# Patient Record
Sex: Female | Born: 1995 | Race: Black or African American | Marital: Married | Smoking: Never smoker
Health system: Southern US, Community
[De-identification: ages and names within clinical notes are randomized; demographics above are authoritative.]

## PROBLEM LIST (undated history)

## (undated) DIAGNOSIS — Z789 Other specified health status: Secondary | ICD-10-CM

## (undated) HISTORY — DX: Other specified health status: Z78.9

## (undated) HISTORY — PX: NO PAST SURGERIES: SHX2092

---

## 2020-05-15 NOTE — L&D Delivery Note (Signed)
OB/GYN Faculty Practice Delivery Note  Lisa Santiago is a 25 y.o. G2P1001 s/p SVD at [redacted]w[redacted]d. She presented in active labor with spontaneous ROM at home prior to arrival.   ROM: 0h 43m with meconium stained fluid GBS Status:  Negative/-- (09/06 1542)  Labor Progress: Initial SVE: 10/100.   Delivery Date/Time: 10/1 0025  Delivery: Presented to MAU complete and pushing. Called to room by MAU provider. No interpreter available in [place], no support personnel present at time of delivery. With assistance from [place], CNM, head delivered L OA. No nuchal cord present. Shoulder and body delivered in usual fashion. Infant with spontaneous cry, placed on mother's abdomen, dried and stimulated. Cord clamped x 2 after 1-minute delay, and cut by provider. Cord blood drawn. Placenta delivered spontaneously with gentle cord traction. Fundus boggy with massage and Pitocin. With continued massage and TXA, fundus firmed with improvement in bleeding. Labia, perineum, vagina, and cervix inspected inspected with 1st and 2nd degree perineal lacerations that were repaired in usual fashion with 3.0 vicryl.    Delivery call was made prior to delivery due to prolonged fetal bradycardia, however upon delivery infant with spontaneous cry as noted above. Reminder of infant care was per MAU RN team.   Baby Weight: pending  Placenta: Sent to L&D Complications: None Lacerations: 1st degree (maternal right) and 2nd degree (left) perineal EBL: 450 mL Analgesia: Lidocaine for repair   Infant:  APGAR (1 MIN): 9   APGAR (5 MINS): 9   APGAR (10 MINS):     [name], DO  OB Family Medicine Fellow, [facility] for [facility], [facility] 02/12/2021, 1:32 AM

## 2020-09-13 DIAGNOSIS — Z13220 Encounter for screening for lipoid disorders: Secondary | ICD-10-CM | POA: Diagnosis not present

## 2020-09-13 DIAGNOSIS — Z331 Pregnant state, incidental: Secondary | ICD-10-CM | POA: Diagnosis not present

## 2020-09-13 DIAGNOSIS — Z131 Encounter for screening for diabetes mellitus: Secondary | ICD-10-CM | POA: Diagnosis not present

## 2020-09-13 DIAGNOSIS — Z0000 Encounter for general adult medical examination without abnormal findings: Secondary | ICD-10-CM | POA: Diagnosis not present

## 2020-09-13 DIAGNOSIS — N760 Acute vaginitis: Secondary | ICD-10-CM | POA: Diagnosis not present

## 2020-09-29 ENCOUNTER — Ambulatory Visit (INDEPENDENT_AMBULATORY_CARE_PROVIDER_SITE_OTHER): Payer: BC Managed Care – PPO

## 2020-09-29 ENCOUNTER — Other Ambulatory Visit: Payer: Self-pay

## 2020-09-29 ENCOUNTER — Ambulatory Visit (INDEPENDENT_AMBULATORY_CARE_PROVIDER_SITE_OTHER): Payer: BC Managed Care – PPO | Admitting: *Deleted

## 2020-09-29 DIAGNOSIS — Z3201 Encounter for pregnancy test, result positive: Secondary | ICD-10-CM | POA: Diagnosis not present

## 2020-09-29 DIAGNOSIS — Z3490 Encounter for supervision of normal pregnancy, unspecified, unspecified trimester: Secondary | ICD-10-CM | POA: Diagnosis not present

## 2020-09-29 DIAGNOSIS — O0930 Supervision of pregnancy with insufficient antenatal care, unspecified trimester: Secondary | ICD-10-CM

## 2020-09-29 LAB — POCT PREGNANCY, URINE: Preg Test, Ur: POSITIVE — AB

## 2020-09-29 NOTE — Progress Notes (Signed)
Pt presents for urine pregnancy test which is positive. She speaks Mandingo. There is no interpreter available through [employer] at the time of appointment. Her father is present during the visit and is acting as interpreter. Pt states LMP during the last week of December - unsure of exact date. She came to the [place] on July 11, 2020. She had not had any pregnancy test performed prior to that time. Pt's [facility] 5/2 and the doctor told them that she was pregnant due to positive urine pregnancy test and physical exam. Pt is not aware of whether or not she has felt FM. She denies vaginal bleeding or abdominal pain. Limited US performed in office which confirms IUP. Cardiac activity present w/FHR = 153 bpm . Femur length measurement is consistent with [redacted]w[redacted]d, EDD 02/14/21. Vigorous fetal movement was observed during scan. Pt was advised of need for ultrasound for anatomy and confirmation of EDD. Dr. Debroah Loop in room to speak with pt and her father. [place] for anatomy/dates scheduled on 10/15/20 @ 2:45. Prenatal care will be scheduled. [facility] if she develops vaginal bleeding or abdominal pain. She voiced understanding of all information and instructions given.

## 2020-10-13 ENCOUNTER — Encounter: Payer: Self-pay | Admitting: *Deleted

## 2020-10-14 ENCOUNTER — Other Ambulatory Visit (HOSPITAL_COMMUNITY)
Admission: RE | Admit: 2020-10-14 | Discharge: 2020-10-14 | Disposition: A | Discharge status: Home / Self Care | Payer: BC Managed Care – PPO | Source: Ambulatory Visit | Attending: Advanced Practice Midwife | Admitting: Advanced Practice Midwife

## 2020-10-14 ENCOUNTER — Encounter: Payer: Self-pay | Admitting: Advanced Practice Midwife

## 2020-10-14 ENCOUNTER — Other Ambulatory Visit: Payer: Self-pay

## 2020-10-14 ENCOUNTER — Ambulatory Visit (INDEPENDENT_AMBULATORY_CARE_PROVIDER_SITE_OTHER): Payer: BC Managed Care – PPO | Admitting: Advanced Practice Midwife

## 2020-10-14 VITALS — BP 122/81 | HR 106 | Ht 62.0 in | Wt 150.3 lb

## 2020-10-14 DIAGNOSIS — Z3A22 22 weeks gestation of pregnancy: Secondary | ICD-10-CM

## 2020-10-14 DIAGNOSIS — Z3480 Encounter for supervision of other normal pregnancy, unspecified trimester: Secondary | ICD-10-CM | POA: Insufficient documentation

## 2020-10-14 NOTE — Progress Notes (Signed)
Subjective:   Lisa Santiago is a 25 y.o. G2P1001 at [redacted]w[redacted]d by 2nd trimester US done at 20 weeks being seen today for her first obstetrical visit.    Gynecological/Obstetrical History: Her obstetrical history is significant for none, denies complications with prior pregnancy and delivery. Patient does intend to breast feed. Pregnancy history fully reviewed. Patient reports no complaints.   Medical History/ROS:   Social History: Patient denies current usage of tobacco, alcohol, or drugs.  Patient reports the FOB is Joselyn Glassman who is living with her here in the [place]. Patient reports that her last baby had a different.  Patient reports that she lives with FOB.   HISTORY: OB History  Gravida Para Term Preterm AB Living  2 1 1  0 0 1  SAB IAB Ectopic Multiple Live Births  0 0 0 0 1    # Outcome Date GA Lbr Len/2nd Weight Sex Delivery Anes PTL Lv  2 Current           1 Term 05/13/19 [redacted]w[redacted]d   M Vag-Spont   LIV    Last pap smear was done never  Past Medical History:  Diagnosis Date  . Medical history non-contributory    Past Surgical History:  Procedure Laterality Date  . NO PAST SURGERIES     Family History  Problem Relation Age of Onset  . Healthy Mother   . Hypertension Father    Social History   Tobacco Use  . Smoking status: Never Smoker  . Smokeless tobacco: Never Used  Vaping Use  . Vaping Use: Never used  Substance Use Topics  . Alcohol use: Not Currently  . Drug use: Never   No Known Allergies Current Outpatient Medications on File Prior to Visit  Medication Sig Dispense Refill  . Prenatal Vit-Fe Fumarate-FA (PREPLUS) 27-1 MG TABS Take by mouth.     No current facility-administered medications on file prior to visit.    Review of Systems Pertinent items noted in HPI and remainder of comprehensive ROS otherwise negative.  Exam   Vitals:   10/14/20 1439 10/14/20 1446  BP: 122/81   Pulse: (!) 106   Weight: 150 lb 4.8 oz (68.2 kg)   Height:  5'  2" (1.575 m)   Fetal Heart Rate (bpm): 150  Physical Exam Constitutional:      General: She is not in acute distress. Genitourinary:     Genitourinary Comments:  External: no lesion Vagina: small amount of white discharge Cervix: pink, smooth, no CMT Uterus: AGA Adnexa: NT  HENT:     Head: Normocephalic.  Cardiovascular:     Rate and Rhythm: Normal rate.  Pulmonary:     Effort: Pulmonary effort is normal.  Abdominal:     Palpations: Abdomen is soft.  Neurological:     General: No focal deficit present.     Mental Status: She is alert and oriented to person, place, and time.  Skin:    General: Skin is warm and dry.  Psychiatric:        Mood and Affect: Mood normal.        Behavior: Behavior normal.  Vitals and nursing note reviewed. Exam conducted with a chaperone present.     Assessment:   25 y.o. year old G2P1001 Patient Active Problem List   Diagnosis Date Noted  . Supervision of other normal pregnancy, antepartum 10/14/2020     Plan:  1. Supervision of other normal pregnancy, antepartum - routine care - Has anatomy scan tomorrow  2. [redacted] weeks gestation of pregnancy   Problem list reviewed and updated. Routine obstetric precautions reviewed.  Orders Placed This Encounter  Procedures  . Culture, OB Urine  . Hemoglobin A1c  . CBC/D/Plt+RPR+Rh+ABO+RubIgG...  . AFP/Quad Scr    Weight 150 lb Dating 2nd trimester [place] 22.3 weeks today (10/14/2020) No diabetic    Scheduling Instructions:     Weight 150 lb     Dating 2nd trimester [place]     22.3 weeks today (10/14/2020)     No diabetic  . AMBULATORY REFERRAL TO [facility]    Referral Priority:   Routine    Referral Type:   Consultation    Referral Reason:   Specialty Services Required    Referral Location:   [facility]    Number of Visits Requested:   1    Return in about 4 weeks (around 11/11/2020).     Thressa Sheller DNP, CNM  10/14/20  3:03 PM

## 2020-10-15 ENCOUNTER — Other Ambulatory Visit: Payer: Self-pay | Admitting: Obstetrics & Gynecology

## 2020-10-15 ENCOUNTER — Ambulatory Visit: Payer: BC Managed Care – PPO | Attending: Obstetrics & Gynecology

## 2020-10-15 DIAGNOSIS — O0932 Supervision of pregnancy with insufficient antenatal care, second trimester: Secondary | ICD-10-CM

## 2020-10-15 DIAGNOSIS — O359XX0 Maternal care for (suspected) fetal abnormality and damage, unspecified, not applicable or unspecified: Secondary | ICD-10-CM | POA: Diagnosis not present

## 2020-10-15 DIAGNOSIS — O0930 Supervision of pregnancy with insufficient antenatal care, unspecified trimester: Secondary | ICD-10-CM

## 2020-10-15 DIAGNOSIS — Z3A22 22 weeks gestation of pregnancy: Secondary | ICD-10-CM | POA: Diagnosis not present

## 2020-10-15 DIAGNOSIS — Z3490 Encounter for supervision of normal pregnancy, unspecified, unspecified trimester: Secondary | ICD-10-CM | POA: Insufficient documentation

## 2020-10-15 DIAGNOSIS — Z3480 Encounter for supervision of other normal pregnancy, unspecified trimester: Secondary | ICD-10-CM | POA: Diagnosis not present

## 2020-10-15 LAB — GC/CHLAMYDIA PROBE AMP (~~LOC~~) NOT AT ARMC
Chlamydia: NEGATIVE
Comment: NEGATIVE
Comment: NORMAL
Neisseria Gonorrhea: NEGATIVE

## 2020-10-15 LAB — CYTOLOGY - PAP
Comment: NEGATIVE
Diagnosis: NEGATIVE
High risk HPV: NEGATIVE

## 2020-10-15 LAB — CBC/D/PLT+RPR+RH+ABO+RUBIGG...
Antibody Screen: NEGATIVE
Basophils Absolute: 0 10*3/uL (ref 0.0–0.2)
Basos: 0 %
EOS (ABSOLUTE): 0.3 10*3/uL (ref 0.0–0.4)
Eos: 2 %
HCV Ab: <0.1 s/co ratio (ref 0.0–0.9)
HIV Screen 4th Generation wRfx: NONREACTIVE
Hematocrit: 38.4 % (ref 34.0–46.6)
Hemoglobin: 12.9 g/dL (ref 11.1–15.9)
Hepatitis B Surface Ag: NEGATIVE
Immature Grans (Abs): 0.1 10*3/uL (ref 0.0–0.1)
Immature Granulocytes: 1 %
Lymphocytes Absolute: 2.2 10*3/uL (ref 0.7–3.1)
Lymphs: 21 %
MCH: 24.1 pg — ABNORMAL LOW (ref 26.6–33.0)
MCHC: 33.6 g/dL (ref 31.5–35.7)
MCV: 72 fL — ABNORMAL LOW (ref 79–97)
Monocytes Absolute: 0.5 10*3/uL (ref 0.1–0.9)
Monocytes: 5 %
Neutrophils Absolute: 7.4 10*3/uL — ABNORMAL HIGH (ref 1.4–7.0)
Neutrophils: 71 %
Platelets: 298 10*3/uL (ref 150–450)
RBC: 5.35 x10E6/uL — ABNORMAL HIGH (ref 3.77–5.28)
RDW: 15.3 % (ref 11.7–15.4)
RPR Ser Ql: NONREACTIVE
Rh Factor: POSITIVE
Rubella Antibodies, IGG: 15.3 index (ref >0.99)
WBC: 10.4 10*3/uL (ref 3.4–10.8)

## 2020-10-15 LAB — HEMOGLOBIN A1C
Est. average glucose Bld gHb Est-mCnc: 91 mg/dL
Hgb A1c MFr Bld: 4.8 % (ref 4.8–5.6)

## 2020-10-15 LAB — AFP TUMOR MARKER: AFP, Serum, Tumor Marker: 117 ng/mL — ABNORMAL HIGH (ref 0.0–4.7)

## 2020-10-15 LAB — HCV INTERPRETATION

## 2020-10-16 LAB — URINE CULTURE, OB REFLEX

## 2020-10-16 LAB — CULTURE, OB URINE

## 2020-10-18 ENCOUNTER — Telehealth: Payer: Self-pay

## 2020-10-18 ENCOUNTER — Other Ambulatory Visit: Payer: Self-pay | Admitting: *Deleted

## 2020-10-18 DIAGNOSIS — Z3201 Encounter for pregnancy test, result positive: Secondary | ICD-10-CM | POA: Diagnosis not present

## 2020-10-18 DIAGNOSIS — Z3480 Encounter for supervision of other normal pregnancy, unspecified trimester: Secondary | ICD-10-CM

## 2020-10-18 DIAGNOSIS — O283 Abnormal ultrasonic finding on antenatal screening of mother: Secondary | ICD-10-CM

## 2020-10-18 DIAGNOSIS — Z331 Pregnant state, incidental: Secondary | ICD-10-CM | POA: Diagnosis not present

## 2020-10-18 DIAGNOSIS — N760 Acute vaginitis: Secondary | ICD-10-CM | POA: Diagnosis not present

## 2020-10-18 NOTE — Telephone Encounter (Addendum)
-----   Message from Armando Reichert, CNM sent at 10/16/2020  8:02 PM EDT ----- Can someone call lab to see if we can get this afp changed. It was put in as a quad screen, but it wasn't resulted properly. It was done as a tumor marker. It needs to be the AFP tetra or quad screen.   Correct order placed. [facility] tech notified to call lab to run correct test.

## 2020-10-21 LAB — AFP, SERUM, OPEN SPINA BIFIDA
AFP MoM: 1.63
AFP Value: 133 ng/mL
Gest. Age on Collection Date: 22.3 weeks
Maternal Age At EDD: 23.5 yr
OSBR Risk 1 IN: 1944
Test Results:: NEGATIVE
Weight: 150 [lb_av]

## 2020-10-21 LAB — SPECIMEN STATUS REPORT

## 2020-11-12 ENCOUNTER — Ambulatory Visit: Payer: BC Managed Care – PPO | Admitting: *Deleted

## 2020-11-12 ENCOUNTER — Ambulatory Visit: Payer: BC Managed Care – PPO | Attending: Obstetrics and Gynecology

## 2020-11-12 ENCOUNTER — Other Ambulatory Visit: Payer: Self-pay

## 2020-11-12 VITALS — BP 144/72 | HR 125

## 2020-11-12 DIAGNOSIS — O358XX0 Maternal care for other (suspected) fetal abnormality and damage, not applicable or unspecified: Secondary | ICD-10-CM

## 2020-11-12 DIAGNOSIS — O283 Abnormal ultrasonic finding on antenatal screening of mother: Secondary | ICD-10-CM | POA: Insufficient documentation

## 2020-11-12 DIAGNOSIS — O321XX0 Maternal care for breech presentation, not applicable or unspecified: Secondary | ICD-10-CM | POA: Diagnosis not present

## 2020-11-12 DIAGNOSIS — Z362 Encounter for other antenatal screening follow-up: Secondary | ICD-10-CM | POA: Diagnosis not present

## 2020-11-12 DIAGNOSIS — Z3A26 26 weeks gestation of pregnancy: Secondary | ICD-10-CM

## 2020-11-12 DIAGNOSIS — Z3480 Encounter for supervision of other normal pregnancy, unspecified trimester: Secondary | ICD-10-CM

## 2020-11-12 DIAGNOSIS — O0932 Supervision of pregnancy with insufficient antenatal care, second trimester: Secondary | ICD-10-CM

## 2020-11-12 DIAGNOSIS — Z3687 Encounter for antenatal screening for uncertain dates: Secondary | ICD-10-CM

## 2020-11-16 ENCOUNTER — Other Ambulatory Visit: Payer: Self-pay

## 2020-11-16 ENCOUNTER — Ambulatory Visit (INDEPENDENT_AMBULATORY_CARE_PROVIDER_SITE_OTHER): Payer: BC Managed Care – PPO | Admitting: Student

## 2020-11-16 VITALS — BP 128/81 | HR 97 | Wt 155.0 lb

## 2020-11-16 DIAGNOSIS — Z789 Other specified health status: Secondary | ICD-10-CM

## 2020-11-16 DIAGNOSIS — Z3480 Encounter for supervision of other normal pregnancy, unspecified trimester: Secondary | ICD-10-CM

## 2020-11-16 DIAGNOSIS — Z3A27 27 weeks gestation of pregnancy: Secondary | ICD-10-CM

## 2020-11-16 NOTE — Progress Notes (Signed)
Cousin of patient's interpreting for today visit. CMA tried language resources and [employer] audio interpreter with no success of getting a Mandingo interpreter.

## 2020-11-16 NOTE — Progress Notes (Signed)
   PRENATAL VISIT NOTE  Subjective:  Lisa Santiago is a 25 y.o. G2P1001 at [redacted]w[redacted]d being seen today for ongoing prenatal care.  She is currently monitored for the following issues for this low-risk pregnancy and has Supervision of other normal pregnancy, antepartum on their problem list.  Patient reports no complaints.   Language barrier significantly impacted the quality of the interaction.  Contractions: Not present. Vag. Bleeding: None.  Movement: Present. Denies leaking of fluid.   The following portions of the patient's history were reviewed and updated as appropriate: allergies, current medications, past family history, past medical history, past social history, past surgical history and problem list.   Objective:   Vitals:   11/16/20 1617  BP: 128/81  Pulse: 97  Weight: 155 lb (70.3 kg)    Fetal Status: Fetal Heart Rate (bpm): 140 Fundal Height: 27 cm Movement: Present     General:  Alert, oriented and cooperative. Patient is in no acute distress.  Skin: Skin is warm and dry. No rash noted.   Cardiovascular: Normal heart rate noted  Respiratory: Normal respiratory effort, no problems with respiration noted  Abdomen: Soft, gravid, appropriate for gestational age.  Pain/Pressure: Absent     Pelvic: Cervical exam deferred        Extremities: Normal range of motion.  Edema: None  Mental Status: Normal mood and affect. Normal behavior. Normal judgment and thought content.   Assessment and Plan:  Pregnancy: G2P1001 at [redacted]w[redacted]d 1. Supervision of other normal pregnancy, antepartum -reviewed GTT; cousin helped explained testing and importance of fasting before testing -due to language barriers and cousin as Nurse, learning disability -discussed birth control, she does not want to use any method. Her husband is in [place].  -reviewed that patient will breast feed.  -suggested [facility] for baby's pediatrician Preterm labor symptoms and general obstetric precautions including but not  limited to vaginal bleeding, contractions, leaking of fluid and fetal movement were reviewed in detail with the patient. Please refer to After Visit Summary for other counseling recommendations.   Return in about 3 weeks (around 12/07/2020), or LROB.  Future Appointments  Date Time Provider Department Center  11/26/2020  8:50 AM [facility]  12/09/2020  8:20 AM [facility]  12/09/2020  8:55 AM Reva Bores, MD [facility]    Marylene Land, CNM

## 2020-11-16 NOTE — Patient Instructions (Addendum)
AREA PEDIATRIC/FAMILY PRACTICE PHYSICIANS  [place] ([id number]) [facility] Deirdre Priest, MD; Lum Babe, MD; Sheffield Slider, MD; Leveda Anna, MD; McDiarmid, MD; Jerene Bears, MD; Jennette Kettle, MD; Gwendolyn Grant, MD [address], [place], [place] [address] [phone] Mon-Fri 8:30-12:30, 1:30-5:00 Providers come to see babies at [facility] Accepting Medicaid     NEXT APPOINTMENT on 17 July IS FASTING

## 2020-11-26 ENCOUNTER — Other Ambulatory Visit: Payer: BC Managed Care – PPO

## 2020-11-26 ENCOUNTER — Other Ambulatory Visit: Payer: Self-pay

## 2020-11-26 DIAGNOSIS — Z3480 Encounter for supervision of other normal pregnancy, unspecified trimester: Secondary | ICD-10-CM

## 2020-11-27 LAB — CBC
Hematocrit: 38.1 % (ref 34.0–46.6)
Hemoglobin: 12.4 g/dL (ref 11.1–15.9)
MCH: 24.2 pg — ABNORMAL LOW (ref 26.6–33.0)
MCHC: 32.5 g/dL (ref 31.5–35.7)
MCV: 74 fL — ABNORMAL LOW (ref 79–97)
Platelets: 241 10*3/uL (ref 150–450)
RBC: 5.13 x10E6/uL (ref 3.77–5.28)
RDW: 15.2 % (ref 11.7–15.4)
WBC: 7.6 10*3/uL (ref 3.4–10.8)

## 2020-11-27 LAB — GLUCOSE TOLERANCE, 2 HOURS W/ 1HR
Glucose, 1 hour: 117 mg/dL (ref 65–179)
Glucose, 2 hour: 113 mg/dL (ref 65–152)
Glucose, Fasting: 73 mg/dL (ref 65–91)

## 2020-11-27 LAB — HIV ANTIBODY (ROUTINE TESTING W REFLEX): HIV Screen 4th Generation wRfx: NONREACTIVE

## 2020-11-27 LAB — RPR: RPR Ser Ql: NONREACTIVE

## 2020-12-09 ENCOUNTER — Ambulatory Visit (INDEPENDENT_AMBULATORY_CARE_PROVIDER_SITE_OTHER): Payer: BC Managed Care – PPO | Admitting: Family Medicine

## 2020-12-09 ENCOUNTER — Other Ambulatory Visit: Payer: BC Managed Care – PPO

## 2020-12-09 ENCOUNTER — Other Ambulatory Visit: Payer: Self-pay

## 2020-12-09 VITALS — BP 131/85 | HR 123 | Wt 160.0 lb

## 2020-12-09 DIAGNOSIS — Z3A30 30 weeks gestation of pregnancy: Secondary | ICD-10-CM

## 2020-12-09 DIAGNOSIS — Z23 Encounter for immunization: Secondary | ICD-10-CM

## 2020-12-09 DIAGNOSIS — Z3480 Encounter for supervision of other normal pregnancy, unspecified trimester: Secondary | ICD-10-CM

## 2020-12-09 DIAGNOSIS — Z789 Other specified health status: Secondary | ICD-10-CM

## 2020-12-09 NOTE — Addendum Note (Signed)
Addended byVidal Schwalbe on: 12/09/2020 09:05 AM   Modules accepted: Orders

## 2020-12-09 NOTE — Progress Notes (Signed)
   PRENATAL VISIT NOTE  Subjective:  Lisa Santiago is a 25 y.o. G2P1001 at [redacted]w[redacted]d being seen today for ongoing prenatal care.  She is currently monitored for the following issues for this low-risk pregnancy and has Supervision of other normal pregnancy, antepartum and Language barrier on their problem list.  Patient reports no complaints.  Contractions: Not present. Vag. Bleeding: None.  Movement: Present. Denies leaking of fluid.   The following portions of the patient's history were reviewed and updated as appropriate: allergies, current medications, past family history, past medical history, past social history, past surgical history and problem list.   Objective:   Vitals:   12/09/20 0839  BP: 131/85  Pulse: (!) 123  Weight: 160 lb (72.6 kg)    Fetal Status: Fetal Heart Rate (bpm): 135 Fundal Height: 30 cm Movement: Present     General:  Alert, oriented and cooperative. Patient is in no acute distress.  Skin: Skin is warm and dry. No rash noted.   Cardiovascular: Normal heart rate noted  Respiratory: Normal respiratory effort, no problems with respiration noted  Abdomen: Soft, gravid, appropriate for gestational age.  Pain/Pressure: Absent     Pelvic: Cervical exam deferred        Extremities: Normal range of motion.  Edema: None  Mental Status: Normal mood and affect. Normal behavior. Normal judgment and thought content.   Assessment and Plan:  Pregnancy: G2P1001 at [redacted]w[redacted]d 1. Supervision of other normal pregnancy, antepartum Continue routine prenatal care.  2. Language barrier Family member for Mandingo interpreter: Brother used   Preterm labor symptoms and general obstetric precautions including but not limited to vaginal bleeding, contractions, leaking of fluid and fetal movement were reviewed in detail with the patient. Please refer to After Visit Summary for other counseling recommendations.   Return in 2 weeks (on 12/23/2020) for [facility].  No future  appointments.  Reva Bores, MD

## 2020-12-09 NOTE — Patient Instructions (Signed)

## 2020-12-27 ENCOUNTER — Other Ambulatory Visit: Payer: Self-pay

## 2020-12-27 ENCOUNTER — Encounter: Payer: Self-pay | Admitting: Obstetrics and Gynecology

## 2020-12-27 ENCOUNTER — Ambulatory Visit (INDEPENDENT_AMBULATORY_CARE_PROVIDER_SITE_OTHER): Payer: BC Managed Care – PPO | Admitting: Obstetrics and Gynecology

## 2020-12-27 VITALS — BP 126/84 | HR 123 | Wt 163.2 lb

## 2020-12-27 DIAGNOSIS — Z3480 Encounter for supervision of other normal pregnancy, unspecified trimester: Secondary | ICD-10-CM

## 2020-12-27 DIAGNOSIS — Z789 Other specified health status: Secondary | ICD-10-CM

## 2020-12-27 NOTE — Patient Instructions (Signed)

## 2020-12-27 NOTE — Progress Notes (Signed)
Subjective:  Lisa Santiago is a 25 y.o. G2P1001 at [redacted]w[redacted]d being seen today for ongoing prenatal care.  She is currently monitored for the following issues for this low-risk pregnancy and has Supervision of other normal pregnancy, antepartum and Language barrier on their problem list.  Patient reports no complaints.  Contractions: Not present. Vag. Bleeding: None.  Movement: Present. Denies leaking of fluid.   The following portions of the patient's history were reviewed and updated as appropriate: allergies, current medications, past family history, past medical history, past social history, past surgical history and problem list. Problem list updated.  Objective:   Vitals:   12/27/20 1445  BP: 126/84  Pulse: (!) 123  Weight: 163 lb 3.2 oz (74 kg)    Fetal Status: Fetal Heart Rate (bpm): 163   Movement: Present     General:  Alert, oriented and cooperative. Patient is in no acute distress.  Skin: Skin is warm and dry. No rash noted.   Cardiovascular: Normal heart rate noted  Respiratory: Normal respiratory effort, no problems with respiration noted  Abdomen: Soft, gravid, appropriate for gestational age. Pain/Pressure: Absent     Pelvic:  Cervical exam deferred        Extremities: Normal range of motion.  Edema: None  Mental Status: Normal mood and affect. Normal behavior. Normal judgment and thought content.   Urinalysis:      Assessment and Plan:  Pregnancy: G2P1001 at [redacted]w[redacted]d  1. Supervision of other normal pregnancy, antepartum Stable  2. Language barrier Live interrupter used during today's visit  Preterm labor symptoms and general obstetric precautions including but not limited to vaginal bleeding, contractions, leaking of fluid and fetal movement were reviewed in detail with the patient. Please refer to After Visit Summary for other counseling recommendations.  Return in about 2 weeks (around 01/10/2021) for OB visit, face to face, any provider.   Hermina Staggers, MD

## 2021-01-12 ENCOUNTER — Encounter: Payer: BC Managed Care – PPO | Admitting: Obstetrics and Gynecology

## 2021-01-18 ENCOUNTER — Ambulatory Visit (INDEPENDENT_AMBULATORY_CARE_PROVIDER_SITE_OTHER): Payer: BC Managed Care – PPO | Admitting: Nurse Practitioner

## 2021-01-18 ENCOUNTER — Other Ambulatory Visit: Payer: Self-pay

## 2021-01-18 ENCOUNTER — Other Ambulatory Visit (HOSPITAL_COMMUNITY)
Admission: RE | Admit: 2021-01-18 | Discharge: 2021-01-18 | Disposition: A | Discharge status: Home / Self Care | Payer: BC Managed Care – PPO | Source: Ambulatory Visit | Attending: Obstetrics and Gynecology | Admitting: Obstetrics and Gynecology

## 2021-01-18 VITALS — BP 122/86 | HR 113 | Wt 167.8 lb

## 2021-01-18 DIAGNOSIS — O219 Vomiting of pregnancy, unspecified: Secondary | ICD-10-CM

## 2021-01-18 DIAGNOSIS — Z789 Other specified health status: Secondary | ICD-10-CM

## 2021-01-18 DIAGNOSIS — Z3480 Encounter for supervision of other normal pregnancy, unspecified trimester: Secondary | ICD-10-CM | POA: Diagnosis not present

## 2021-01-18 NOTE — Progress Notes (Signed)
    Subjective:  Lisa Santiago is a 25 y.o. G2P1001 at [redacted]w[redacted]d being seen today for ongoing prenatal care.  She is currently monitored for the following issues for this low-risk pregnancy and has Supervision of other normal pregnancy, antepartum and Language barrier on their problem list.  Patient reports vomiting and dizziness .  Contractions: Not present. Vag. Bleeding: None.  Movement: Present. Denies leaking of fluid.   The following portions of the patient's history were reviewed and updated as appropriate: allergies, current medications, past family history, past medical history, past social history, past surgical history and problem list. Problem list updated.  Objective:   Vitals:   01/18/21 1430  BP: 122/86  Pulse: (!) 113  Weight: 167 lb 12.8 oz (76.1 kg)    Fetal Status: Fetal Heart Rate (bpm): 144 Fundal Height: 36 cm Movement: Present     General:  Alert, oriented and cooperative. Patient is in no acute distress.  Skin: Skin is warm and dry. No rash noted.   Cardiovascular: Normal heart rate noted  Respiratory: Normal respiratory effort, no problems with respiration noted  Abdomen: Soft, gravid, appropriate for gestational age. Pain/Pressure: Present     Pelvic:  Cervical exam deferred      Vaginal swabs done  Extremities: Normal range of motion.  Edema: None  Mental Status: Normal mood and affect. Normal behavior. Normal judgment and thought content.   Urinalysis:      Assessment and Plan:  Pregnancy: G2P1001 at [redacted]w[redacted]d  1. Supervision of other normal pregnancy, antepartum Baby is moving Reviewed where to go for birth - address of [facility] Entrance C given in wrap up  - GC/Chlamydia probe amp ([place])not at [facility] - Culture, beta strep (group b only)  2. Language barrier Online interpreter used for the entire visit - had to speak quite loudly for interpreter to hear provider or patient.  Female interpreter who takes a long time to begin to speak after  hearing info to give to patient - tedious process today.  Difficult to give info to patient.  3. Nausea and vomiting in pregnancy Vomited yesterday.  Has been dizzy too from time to time. None today Advised small meals every 2-3 hours  Preterm labor symptoms and general obstetric precautions including but not limited to vaginal bleeding, contractions, leaking of fluid and fetal movement were reviewed in detail with the patient. Please refer to After Visit Summary for other counseling recommendations.  Return in about 1 week (around 01/25/2021) for in person ROB.  Nolene Bernheim, RN, MSN, NP-BC Nurse Practitioner, [facility] for [employer], [facility] 01/18/2021 4:28 PM

## 2021-01-18 NOTE — Patient Instructions (Signed)
[facility]  [address] [place] - Entrance C when it is time to have the baby.

## 2021-01-18 NOTE — Progress Notes (Signed)
Pt states has been dizzy & vomiting for a while now.

## 2021-01-20 LAB — GC/CHLAMYDIA PROBE AMP (~~LOC~~) NOT AT ARMC
Chlamydia: NEGATIVE
Comment: NEGATIVE
Comment: NORMAL
Neisseria Gonorrhea: NEGATIVE

## 2021-01-22 LAB — CULTURE, BETA STREP (GROUP B ONLY): Strep Gp B Culture: NEGATIVE

## 2021-01-26 ENCOUNTER — Other Ambulatory Visit: Payer: Self-pay

## 2021-01-26 ENCOUNTER — Encounter: Payer: Self-pay | Admitting: Family Medicine

## 2021-01-26 ENCOUNTER — Ambulatory Visit (INDEPENDENT_AMBULATORY_CARE_PROVIDER_SITE_OTHER): Payer: BC Managed Care – PPO | Admitting: Family Medicine

## 2021-01-26 VITALS — BP 136/88 | HR 89 | Wt 167.2 lb

## 2021-01-26 DIAGNOSIS — Z789 Other specified health status: Secondary | ICD-10-CM

## 2021-01-26 DIAGNOSIS — Z3480 Encounter for supervision of other normal pregnancy, unspecified trimester: Secondary | ICD-10-CM

## 2021-01-26 NOTE — Progress Notes (Signed)
   Subjective:  Lisa Santiago is a 25 y.o. G2P1001 at [redacted]w[redacted]d being seen today for ongoing prenatal care.  She is currently monitored for the following issues for this low-risk pregnancy and has Supervision of other normal pregnancy, antepartum and Language barrier on their problem list.  Patient reports no complaints.  Contractions: Not present. Vag. Bleeding: None.  Movement: Present. Denies leaking of fluid.   The following portions of the patient's history were reviewed and updated as appropriate: allergies, current medications, past family history, past medical history, past social history, past surgical history and problem list. Problem list updated.  Objective:   Vitals:   01/26/21 1551  BP: 136/88  Pulse: 89  Weight: 167 lb 3.2 oz (75.8 kg)    Fetal Status: Fetal Heart Rate (bpm): 141   Movement: Present     General:  Alert, oriented and cooperative. Patient is in no acute distress.  Skin: Skin is warm and dry. No rash noted.   Cardiovascular: Normal heart rate noted  Respiratory: Normal respiratory effort, no problems with respiration noted  Abdomen: Soft, gravid, appropriate for gestational age. Pain/Pressure: Present     Pelvic: Vag. Bleeding: None     Cervical exam deferred        Extremities: Normal range of motion.  Edema: None  Mental Status: Normal mood and affect. Normal behavior. Normal judgment and thought content.   Urinalysis:      Assessment and Plan:  Pregnancy: G2P1001 at [redacted]w[redacted]d  1. Supervision of other normal pregnancy, antepartum BP and FHR normal Discuss IOL at next visit  2. Language barrier Mandingo Father interpreting at her request during visit  Term labor symptoms and general obstetric precautions including but not limited to vaginal bleeding, contractions, leaking of fluid and fetal movement were reviewed in detail with the patient. Please refer to After Visit Summary for other counseling recommendations.  Return in 1 week (on 02/02/2021) for  [facility], ob visit.   Venora Maples, MD

## 2021-01-26 NOTE — Patient Instructions (Signed)

## 2021-02-02 ENCOUNTER — Ambulatory Visit (INDEPENDENT_AMBULATORY_CARE_PROVIDER_SITE_OTHER): Payer: BC Managed Care – PPO | Admitting: Family Medicine

## 2021-02-02 ENCOUNTER — Other Ambulatory Visit: Payer: Self-pay

## 2021-02-02 VITALS — BP 122/83 | HR 114 | Wt 171.0 lb

## 2021-02-02 DIAGNOSIS — Z3480 Encounter for supervision of other normal pregnancy, unspecified trimester: Secondary | ICD-10-CM

## 2021-02-02 DIAGNOSIS — Z789 Other specified health status: Secondary | ICD-10-CM

## 2021-02-02 NOTE — Progress Notes (Signed)
   PRENATAL VISIT NOTE  Subjective:  Lisa Santiago is a 25 y.o. G2P1001 at [redacted]w[redacted]d being seen today for ongoing prenatal care.  She is currently monitored for the following issues for this low-risk pregnancy and has Supervision of other normal pregnancy, antepartum and Language barrier on their problem list.  Patient reports no complaints.  Contractions: Irritability. Vag. Bleeding: None.  Movement: Present. Denies leaking of fluid.   The following portions of the patient's history were reviewed and updated as appropriate: allergies, current medications, past family history, past medical history, past social history, past surgical history and problem list.   Objective:   Vitals:   02/02/21 1514  BP: 122/83  Pulse: (!) 114  Weight: 171 lb (77.6 kg)    Fetal Status: Fetal Heart Rate (bpm): 133 Fundal Height: 38 cm Movement: Present  Presentation: Vertex  General:  Alert, oriented and cooperative. Patient is in no acute distress.  Skin: Skin is warm and dry. No rash noted.   Cardiovascular: Normal heart rate noted  Respiratory: Normal respiratory effort, no problems with respiration noted  Abdomen: Soft, gravid, appropriate for gestational age.  Pain/Pressure: Present     Pelvic: Cervical exam deferred        Extremities: Normal range of motion.  Edema: None  Mental Status: Normal mood and affect. Normal behavior. Normal judgment and thought content.   Assessment and Plan:  Pregnancy: G2P1001 at [redacted]w[redacted]d 1. Supervision of other normal pregnancy, antepartum FHT and FH normal  2. Language barrier Patient's father is interpreter  Preterm labor symptoms and general obstetric precautions including but not limited to vaginal bleeding, contractions, leaking of fluid and fetal movement were reviewed in detail with the patient. Please refer to After Visit Summary for other counseling recommendations.   Return in about 1 week (around 02/09/2021) for OB f/u, .  No future  appointments.  Levie Heritage, DO

## 2021-02-11 ENCOUNTER — Encounter: Payer: Self-pay | Admitting: Obstetrics and Gynecology

## 2021-02-11 ENCOUNTER — Ambulatory Visit (INDEPENDENT_AMBULATORY_CARE_PROVIDER_SITE_OTHER): Payer: BC Managed Care – PPO | Admitting: Obstetrics and Gynecology

## 2021-02-11 ENCOUNTER — Other Ambulatory Visit: Payer: Self-pay

## 2021-02-11 ENCOUNTER — Inpatient Hospital Stay (HOSPITAL_COMMUNITY)
Admission: AD | Admit: 2021-02-11 | Discharge: 2021-02-14 | DRG: 807 | Disposition: A | Discharge status: Home / Self Care | Payer: BC Managed Care – PPO | Attending: Obstetrics and Gynecology | Admitting: Obstetrics and Gynecology

## 2021-02-11 VITALS — BP 122/85 | HR 122 | Wt 166.8 lb

## 2021-02-11 DIAGNOSIS — Z3A39 39 weeks gestation of pregnancy: Secondary | ICD-10-CM

## 2021-02-11 DIAGNOSIS — Z3490 Encounter for supervision of normal pregnancy, unspecified, unspecified trimester: Secondary | ICD-10-CM | POA: Diagnosis present

## 2021-02-11 DIAGNOSIS — Z789 Other specified health status: Secondary | ICD-10-CM

## 2021-02-11 DIAGNOSIS — R000 Tachycardia, unspecified: Secondary | ICD-10-CM | POA: Diagnosis not present

## 2021-02-11 DIAGNOSIS — Z3480 Encounter for supervision of other normal pregnancy, unspecified trimester: Secondary | ICD-10-CM

## 2021-02-11 DIAGNOSIS — Z23 Encounter for immunization: Secondary | ICD-10-CM

## 2021-02-11 DIAGNOSIS — O479 False labor, unspecified: Secondary | ICD-10-CM | POA: Diagnosis not present

## 2021-02-11 NOTE — Progress Notes (Signed)
Tried getting Mandingo interpreter on [employer] and both resources said "Mandingo language is unavailable"

## 2021-02-11 NOTE — Progress Notes (Signed)
   PRENATAL VISIT NOTE  Subjective:  Lisa Santiago is a 25 y.o. G2P1001 at [redacted]w[redacted]d being seen today for ongoing prenatal care.  She is currently monitored for the following issues for this low-risk pregnancy and has Supervision of other normal pregnancy, antepartum and Language barrier on their problem list.  Patient reports no complaints.  Contractions: Irritability.  .  Movement: Present. Denies leaking of fluid.   The following portions of the patient's history were reviewed and updated as appropriate: allergies, current medications, past family history, past medical history, past social history, past surgical history and problem list.   Objective:   Vitals:   02/11/21 1040  BP: 122/85  Pulse: (!) 122  Weight: 166 lb 12.8 oz (75.7 kg)    Fetal Status: Fetal Heart Rate (bpm): 156 Fundal Height: 39 cm Movement: Present     General:  Alert, oriented and cooperative. Patient is in no acute distress.  Skin: Skin is warm and dry. No rash noted.   Cardiovascular: Normal heart rate noted  Respiratory: Normal respiratory effort, no problems with respiration noted  Abdomen: Soft, gravid, appropriate for gestational age.  Pain/Pressure: Present     Pelvic: Cervical exam deferred        Extremities: Normal range of motion.  Edema: None  Mental Status: Normal mood and affect. Normal behavior. Normal judgment and thought content.   Assessment and Plan:  Pregnancy: G2P1001 at [redacted]w[redacted]d 1. Supervision of other normal pregnancy, antepartum Patient is doing well without complaints Plan for IOL at 41 weeks Postdate fetal testing next visit  2. Language barrier [place]ingo interpreter used  Term labor symptoms and general obstetric precautions including but not limited to vaginal bleeding, contractions, leaking of fluid and fetal movement were reviewed in detail with the patient. Please refer to After Visit Summary for other counseling recommendations.   Return in about 1 week (around 02/18/2021)  for in person, ROB, Low risk, NST/bpp.  Future Appointments  Date Time Provider Department Center  02/18/2021 11:15 AM [facility] NST [facility]    Catalina Antigua, MD

## 2021-02-12 ENCOUNTER — Encounter (HOSPITAL_COMMUNITY): Payer: Self-pay | Admitting: Obstetrics and Gynecology

## 2021-02-12 ENCOUNTER — Other Ambulatory Visit: Payer: Self-pay | Admitting: Advanced Practice Midwife

## 2021-02-12 DIAGNOSIS — Z3490 Encounter for supervision of normal pregnancy, unspecified, unspecified trimester: Secondary | ICD-10-CM | POA: Diagnosis present

## 2021-02-12 DIAGNOSIS — O26893 Other specified pregnancy related conditions, third trimester: Secondary | ICD-10-CM | POA: Diagnosis not present

## 2021-02-12 DIAGNOSIS — Z23 Encounter for immunization: Secondary | ICD-10-CM | POA: Diagnosis not present

## 2021-02-12 DIAGNOSIS — Z3A39 39 weeks gestation of pregnancy: Secondary | ICD-10-CM | POA: Diagnosis not present

## 2021-02-12 LAB — CBC
HCT: 39.3 % (ref 36.0–46.0)
Hemoglobin: 13.2 g/dL (ref 12.0–15.0)
MCH: 24.5 pg — ABNORMAL LOW (ref 26.0–34.0)
MCHC: 33.6 g/dL (ref 30.0–36.0)
MCV: 73 fL — ABNORMAL LOW (ref 80.0–100.0)
Platelets: 219 10*3/uL (ref 150–400)
RBC: 5.38 MIL/uL — ABNORMAL HIGH (ref 3.87–5.11)
RDW: 14.2 % (ref 11.5–15.5)
WBC: 9.5 10*3/uL (ref 4.0–10.5)
nRBC: 0 % (ref 0.0–0.2)

## 2021-02-12 LAB — TYPE AND SCREEN
ABO/RH(D): O POS
Antibody Screen: NEGATIVE

## 2021-02-12 LAB — RPR: RPR Ser Ql: NONREACTIVE

## 2021-02-12 MED ORDER — SIMETHICONE 80 MG PO CHEW: 80.0000 mg | CHEWABLE_TABLET | ORAL | Status: DC | PRN

## 2021-02-12 MED ORDER — SENNOSIDES-DOCUSATE SODIUM 8.6-50 MG PO TABS
2.0000 | ORAL_TABLET | ORAL | Status: DC
  Administered 2021-02-12 – 2021-02-13 (×2): 2 via ORAL
  Filled 2021-02-12 (×2): qty 2

## 2021-02-12 MED ORDER — SODIUM CHLORIDE 0.9% FLUSH: 3.0000 mL | Freq: Two times a day (BID) | INTRAVENOUS | Status: DC

## 2021-02-12 MED ORDER — LACTATED RINGERS IV SOLN: 500.0000 mL | INTRAVENOUS | Status: DC | PRN

## 2021-02-12 MED ORDER — TRANEXAMIC ACID-NACL 1000-0.7 MG/100ML-% IV SOLN
1000.0000 mg | INTRAVENOUS | Status: AC
  Administered 2021-02-12: 1000 mg via INTRAVENOUS

## 2021-02-12 MED ORDER — TRANEXAMIC ACID-NACL 1000-0.7 MG/100ML-% IV SOLN
INTRAVENOUS | Status: AC
  Filled 2021-02-12: qty 100

## 2021-02-12 MED ORDER — ONDANSETRON HCL 4 MG/2ML IJ SOLN: 4.0000 mg | INTRAMUSCULAR | Status: DC | PRN

## 2021-02-12 MED ORDER — OXYTOCIN-SODIUM CHLORIDE 30-0.9 UT/500ML-% IV SOLN
INTRAVENOUS | Status: AC
  Filled 2021-02-12: qty 500

## 2021-02-12 MED ORDER — DIBUCAINE (PERIANAL) 1 % EX OINT: 1.0000 "application " | TOPICAL_OINTMENT | CUTANEOUS | Status: DC | PRN

## 2021-02-12 MED ORDER — ACETAMINOPHEN 325 MG PO TABS: 650.0000 mg | ORAL_TABLET | ORAL | Status: DC | PRN

## 2021-02-12 MED ORDER — LIDOCAINE HCL (PF) 1 % IJ SOLN
INTRAMUSCULAR | Status: AC
  Administered 2021-02-12: 5 mL
  Filled 2021-02-12: qty 5

## 2021-02-12 MED ORDER — DIPHENHYDRAMINE HCL 25 MG PO CAPS: 25.0000 mg | ORAL_CAPSULE | Freq: Four times a day (QID) | ORAL | Status: DC | PRN

## 2021-02-12 MED ORDER — PRENATAL MULTIVITAMIN CH
1.0000 | ORAL_TABLET | Freq: Every day | ORAL | Status: DC
  Administered 2021-02-12 – 2021-02-13 (×2): 1 via ORAL
  Filled 2021-02-12 (×2): qty 1

## 2021-02-12 MED ORDER — LIDOCAINE HCL (PF) 1 % IJ SOLN
30.0000 mL | INTRAMUSCULAR | Status: DC | PRN
  Filled 2021-02-12: qty 30

## 2021-02-12 MED ORDER — OXYCODONE-ACETAMINOPHEN 5-325 MG PO TABS: 1.0000 | ORAL_TABLET | ORAL | Status: DC | PRN

## 2021-02-12 MED ORDER — BENZOCAINE-MENTHOL 20-0.5 % EX AERO: 1.0000 "application " | INHALATION_SPRAY | CUTANEOUS | Status: DC | PRN

## 2021-02-12 MED ORDER — MEASLES, MUMPS & RUBELLA VAC IJ SOLR: 0.5000 mL | Freq: Once | INTRAMUSCULAR | Status: DC

## 2021-02-12 MED ORDER — SOD CITRATE-CITRIC ACID 500-334 MG/5ML PO SOLN: 30.0000 mL | ORAL | Status: DC | PRN

## 2021-02-12 MED ORDER — ZOLPIDEM TARTRATE 5 MG PO TABS: 5.0000 mg | ORAL_TABLET | Freq: Every evening | ORAL | Status: DC | PRN

## 2021-02-12 MED ORDER — SODIUM CHLORIDE 0.9% FLUSH: 3.0000 mL | INTRAVENOUS | Status: DC | PRN

## 2021-02-12 MED ORDER — OXYTOCIN-SODIUM CHLORIDE 30-0.9 UT/500ML-% IV SOLN
2.5000 [IU]/h | INTRAVENOUS | Status: DC
  Administered 2021-02-12: 2.5 [IU]/h via INTRAVENOUS

## 2021-02-12 MED ORDER — SODIUM CHLORIDE 0.9 % IV SOLN: 250.0000 mL | INTRAVENOUS | Status: DC | PRN

## 2021-02-12 MED ORDER — IBUPROFEN 600 MG PO TABS
600.0000 mg | ORAL_TABLET | Freq: Four times a day (QID) | ORAL | Status: DC
  Administered 2021-02-12 – 2021-02-14 (×9): 600 mg via ORAL
  Filled 2021-02-12 (×9): qty 1

## 2021-02-12 MED ORDER — OXYCODONE-ACETAMINOPHEN 5-325 MG PO TABS: 2.0000 | ORAL_TABLET | ORAL | Status: DC | PRN

## 2021-02-12 MED ORDER — TETANUS-DIPHTH-ACELL PERTUSSIS 5-2.5-18.5 LF-MCG/0.5 IM SUSY: 0.5000 mL | PREFILLED_SYRINGE | Freq: Once | INTRAMUSCULAR | Status: DC

## 2021-02-12 MED ORDER — COCONUT OIL OIL: 1.0000 "application " | TOPICAL_OIL | Status: DC | PRN

## 2021-02-12 MED ORDER — ERYTHROMYCIN 5 MG/GM OP OINT
TOPICAL_OINTMENT | OPHTHALMIC | Status: AC
  Administered 2021-02-12: 1
  Filled 2021-02-12: qty 1

## 2021-02-12 MED ORDER — WITCH HAZEL-GLYCERIN EX PADS: 1.0000 "application " | MEDICATED_PAD | CUTANEOUS | Status: DC | PRN

## 2021-02-12 MED ORDER — ONDANSETRON HCL 4 MG/2ML IJ SOLN: 4.0000 mg | Freq: Four times a day (QID) | INTRAMUSCULAR | Status: DC | PRN

## 2021-02-12 MED ORDER — ONDANSETRON HCL 4 MG PO TABS: 4.0000 mg | ORAL_TABLET | ORAL | Status: DC | PRN

## 2021-02-12 MED ORDER — LACTATED RINGERS IV SOLN: INTRAVENOUS | Status: DC

## 2021-02-12 MED ORDER — OXYTOCIN BOLUS FROM INFUSION: 333.0000 mL | Freq: Once | INTRAVENOUS | Status: DC

## 2021-02-12 MED ORDER — INFLUENZA VAC SPLIT QUAD 0.5 ML IM SUSY
0.5000 mL | PREFILLED_SYRINGE | INTRAMUSCULAR | Status: AC
  Administered 2021-02-13: 0.5 mL via INTRAMUSCULAR
  Filled 2021-02-12: qty 0.5

## 2021-02-12 NOTE — Lactation Note (Signed)
This note was copied from a baby's chart. Lactation Consultation Note  Patient Name: Lisa Santiago [id number]'L Date: 02/12/2021 Reason for consult: Initial assessment;Term Age:23 hours   LC Note:  Spoke with RN:  mother speaks Mandingo and there is no interpreter available for this patient at this time.  Her father speaks [place] and will be back later this morning to translate.  Mother has been able to latch baby; follow up later today when family member is present.   Maternal Data    Feeding Mother's Current Feeding Choice: Breast Milk and Formula  LATCH Score Latch: Grasps breast easily, tongue down, lips flanged, rhythmical sucking.  Audible Swallowing: Spontaneous and intermittent  Type of Nipple: Everted at rest and after stimulation  Comfort (Breast/Nipple): Soft / non-tender  Hold (Positioning): Assistance needed to correctly position infant at breast and maintain latch.  LATCH Score: 9   Lactation Tools Discussed/Used    Interventions Interventions: Breast feeding basics reviewed;Assisted with latch;Support pillows;Adjust position  Discharge    Consult Status Consult Status: Follow-up Date: 02/12/21 Follow-up type: In-patient    [name] R [name] 02/12/2021, 3:42 AM

## 2021-02-12 NOTE — Lactation Note (Signed)
This note was copied from a baby's chart. Lactation Consultation Note  Patient Name: Lisa Santiago DUKGU'R Date: 02/12/2021 Reason for consult: Initial assessment;Term Age:23 hours   P2 mother whose infant is now 17 hours old.  This is a term baby at 39+5 weeks.  Mother breast fed her first child (now 87 years old) for 1 -1/2 years.    Mother's language is not available via Ipad or phone interpreter.  Her brother and her father interpret for her.  At this session her brother was here.  Mother was breast feeding prior to my arrival.  Pecola Leisure appeared to be sleeping under 2 layers of blankets at her breast.  Reviewed breast feeding basics and offered to help awaken and latch.  Mother interested.  Changed a meconium diaper and assisted with latching.  With constant stimulation he was able to feed for 5 minutes.  Mother will benefit from continued practice with positioning.  Remind mother about her hand/finger placement with latching and feeding; support the breast during feedings.  Encouraged feeding 8-12 times/24 hours or sooner if baby shows cues.  Reviewed cues.  Suggested she call her RN/LC for latch assistance as needed.  RN updated; mother requested pain medication.   Maternal Data Has patient been taught Hand Expression?: Yes Does the patient have breastfeeding experience prior to this delivery?: Yes How long did the patient breastfeed?: 1 year, 9 months  Feeding Mother's Current Feeding Choice: Breast Milk  LATCH Score Latch: Repeated attempts needed to sustain latch, nipple held in mouth throughout feeding, stimulation needed to elicit sucking reflex.  Audible Swallowing: None  Type of Nipple: Everted at rest and after stimulation  Comfort (Breast/Nipple): Soft / non-tender  Hold (Positioning): Assistance needed to correctly position infant at breast and maintain latch.  LATCH Score: 6   Lactation Tools Discussed/Used    Interventions Interventions: Breast feeding  basics reviewed;Assisted with latch;Skin to skin;Breast massage;Hand express;Breast compression;Position options;Support pillows;Adjust position  Discharge    Consult Status Consult Status: Follow-up Date: 02/13/21 Follow-up type: In-patient    Lisa Santiago 02/12/2021, 10:23 AM

## 2021-02-12 NOTE — MAU Note (Signed)
Pt arrived via EMS.  Unable to speak [place].  Interpreter unavailable.  SROM at home tonight per EMS. Pt having some CTX.

## 2021-02-12 NOTE — MAU Note (Signed)
Late entry.  Pt arrived via EMS unable to speak English or get interpreter that speaks Mandingo.  Pt was placed on EFM and VS obtained.  Pt seeming like she wanted to push.  Performed SVE and pt 10/100/+1. Thalia Bloodgood, CNM, called to bedside.  Labor team with Dr. Annia Friendly called.  Pt began pushing about 0012.  Baby was delivered at 0025.

## 2021-02-12 NOTE — H&P (Signed)
OBSTETRIC ADMISSION HISTORY AND PHYSICAL  Lisa Santiago is a 25 y.o. female G2P1001 with IUP at [redacted]w[redacted]d by 20 week [place] presenting for active labor. 10 cm and pushing at arrival in the MAU. She plans on breast feeding. She is unsure for birth control. She received her prenatal [facility] at [facility]   Dating: By 20 wk [place] --->  Estimated Date of Delivery: 02/14/21  Sono:    @[redacted]w[redacted]d , CWD, normal anatomy, breech presentation, 1114g, 78% EFW   Prenatal History/Complications:  Language barrier  Late to [facility] at 22 weeks   Past Medical History: Past Medical History:  Diagnosis Date   Medical history non-contributory     Past Surgical History: Past Surgical History:  Procedure Laterality Date   NO PAST SURGERIES      Obstetrical History: OB History     Gravida  2   Para  1   Term  1   Preterm  0   AB  0   Living  1      SAB  0   IAB  0   Ectopic  0   Multiple  0   Live Births  1           Social History Social History   Socioeconomic History   Marital status: Married    Spouse name: Not on file   Number of children: Not on file   Years of education: Not on file   Highest education level: Not on file  Occupational History   Not on file  Tobacco Use   Smoking status: Never   Smokeless tobacco: Never  Vaping Use   Vaping Use: Never used  Substance and Sexual Activity   Alcohol use: Not Currently   Drug use: Never   Sexual activity: Not Currently    Birth control/protection: None  Other Topics Concern   Not on file  Social History Narrative   Not on file   Social Determinants of Health   Financial Resource Strain: Not on file  Food Insecurity: Food Insecurity Present   Worried About [facility] in the Last Year: Never true   Ran Out of Food in the Last Year: Sometimes true  Transportation Needs: Unmet Transportation Needs   Lack of Transportation (Medical): Yes   Lack of Transportation (Non-Medical): Yes  Physical Activity: Not on file   Stress: Not on file  Social Connections: Not on file    Family History: Family History  Problem Relation Age of Onset   Healthy Mother    Hypertension Father     Allergies: No Known Allergies  Medications Prior to Admission  Medication Sig Dispense Refill Last Dose   Prenatal Vit-Fe Fumarate-FA (PREPLUS) 27-1 MG TABS Take by mouth.        Review of Systems   All systems reviewed and negative except as stated in HPI  Blood pressure 133/82, pulse 99. General appearance: alert, cooperative, and moderate distress Lungs: Normal WOB  Heart: regular rate and rhythm Abdomen: soft, non-tender Extremities: No sign of DVT Presentation: cephalic Fetal monitoringBaseline: 125 bpm, Variability: Good {> 6 bpm), Accelerations: Reactive, and Decelerations: Absent Uterine activityFrequency: Every 2 minutes     Prenatal labs: ABO, Rh: --/--/O POS (10/01 0037) Antibody: NEG (10/01 0037) Rubella: 15.30 (06/02 1520) RPR: Non Reactive (07/15 0847)  HBsAg: Negative (06/02 1520)  HIV: Non Reactive (07/15 0847)  GBS: Negative/-- (09/06 1542)  2 hr Glucola passed Genetic screening  declined  Anatomy 12-06-1970 normal   Prenatal Transfer  Tool  Maternal Diabetes: No Genetic Screening: Declined Maternal Ultrasounds/Referrals: Normal Fetal Ultrasounds or other Referrals:  None Maternal Substance Abuse:  No Significant Maternal Medications:  None Significant Maternal Lab Results: Group B Strep negative  Results for orders placed or performed during the hospital encounter of 02/11/21 (from the past 24 hour(s))  CBC   Collection Time: 02/12/21 12:37 AM  Result Value Ref Range   WBC 9.5 4.0 - 10.5 K/uL   RBC 5.38 (H) 3.87 - 5.11 MIL/uL   Hemoglobin 13.2 12.0 - 15.0 g/dL   HCT 27.7 82.4 - 23.5 %   MCV 73.0 (L) 80.0 - 100.0 fL   MCH 24.5 (L) 26.0 - 34.0 pg   MCHC 33.6 30.0 - 36.0 g/dL   RDW 36.1 44.3 - 15.4 %   Platelets 219 150 - 400 K/uL   nRBC 0.0 0.0 - 0.2 %  Type and screen    Collection Time: 02/12/21 12:37 AM  Result Value Ref Range   ABO/RH(D) O POS    Antibody Screen NEG    Sample Expiration      02/15/2021,2359 Performed at [facility], [address], [place], [place] [address]     Patient Active Problem List   Diagnosis Date Noted   Encounter for induction of labor 02/12/2021   Language barrier 11/16/2020   Supervision of other normal pregnancy, antepartum 10/14/2020    Assessment/Plan:  Lisa Santiago is a 25 y.o. G2P1001 at [redacted]w[redacted]d here for active labor.   #Labor: Complete and pushing upon arrival to MAU. Delivered precipitously in the MAU.  #Pain: None  #FWB: Cat 1 upon arrival prior to pushing  #ID: GBS Neg  #MOF: Breastfeeding  #MOC: Unsure  #Circ: Will clarify with patient during postpartum evaluation   Allayne Stack, DO  02/12/2021, 1:30 AM

## 2021-02-12 NOTE — Discharge Summary (Signed)
Postpartum Discharge Summary  Date of Service updated 02/14/21     Patient Name: Lisa Santiago DOB: [birth year] MRN: [id number]  Date of admission: 02/11/2021 Delivery date:02/12/2021  Delivering provider: Patriciaann Clan  Date of discharge: 02/14/2021  Admitting diagnosis: Encounter for induction of labor [Z34.90] Intrauterine pregnancy: [redacted]w[redacted]d    Secondary diagnosis:  Active Problems:   Language barrier   Encounter for induction of labor   Vaginal delivery  Additional problems: None    Discharge diagnosis: Term Pregnancy Delivered                                              Post partum procedures: None Augmentation: N/A Complications: None  Hospital course: Onset of Labor With Vaginal Delivery      25y.o. yo G2P2002 at 329w5dresented to the MAU on 02/11/2021 complete with a precipitous delivery. Patient had an uncomplicated labor course as follows:  Membrane Rupture Time/Date: 11:53 PM ,02/11/2021   Delivery Method:Vaginal, Spontaneous  Episiotomy: None  Lacerations:  2nd degree;1st degree  Patient had an uncomplicated postpartum course.  She is ambulating, tolerating a regular diet, passing flatus, and urinating well. Patient is discharged home in stable condition on 02/14/21.  Newborn Data: Birth date:02/12/2021  Birth time:12:25 AM  Gender:Female  Living status:Living  Apgars:9 ,9  Weight:3260 g   Magnesium Sulfate received: No BMZ received: No Rhophylac:No MMR:No T-DaP:Given prenatally Flu: N/A Transfusion:No  Physical exam  Vitals:   02/12/21 2136 02/13/21 0700 02/13/21 1648 02/14/21 0509  BP: 110/66 102/65 106/74 111/74  Pulse: 85 81 97 81  Resp: 14 18 20 16   Temp: 98.3 F (36.8 C) 97.6 F (36.4 C) 98.6 F (37 C) 98.7 F (37.1 C)  TempSrc: Oral Oral Oral Oral  SpO2: 100%  100% 100%   General: alert Lochia: appropriate Uterine Fundus: firm Incision: N/A DVT Evaluation: No evidence of DVT seen on physical exam. Labs: Lab Results  Component  Value Date   WBC 9.5 02/12/2021   HGB 13.2 02/12/2021   HCT 39.3 02/12/2021   MCV 73.0 (L) 02/12/2021   PLT 219 02/12/2021   No flowsheet data found. Edinburgh Score: Edinburgh Postnatal Depression Scale Screening Tool 02/13/2021  I have been able to laugh and see the funny side of things. 0  I have looked forward with enjoyment to things. 0  I have blamed myself unnecessarily when things went wrong. 0  I have been anxious or worried for no good reason. 0  I have felt scared or panicky for no good reason. 0  Things have been getting on top of me. 0  I have been so unhappy that I have had difficulty sleeping. 0  I have felt sad or miserable. 0  I have been so unhappy that I have been crying. 0  The thought of harming myself has occurred to me. 0  Edinburgh Postnatal Depression Scale Total 0     After visit meds:  Allergies as of 02/14/2021   No Known Allergies      Medication List     TAKE these medications    ibuprofen 600 MG tablet Commonly known as: ADVIL Take 1 tablet (600 mg total) by mouth every 6 (six) hours.   PrePLUS 27-1 MG Tabs Take by mouth.         Discharge home in stable condition Infant Feeding:  Breast Infant Disposition:home with mother Discharge instruction: per After Visit Summary and Postpartum booklet. Activity: Advance as tolerated. Pelvic rest for 6 weeks.  Diet: routine diet Future Appointments: Future Appointments  Date Time Provider [place]  02/18/2021 11:15 AM [facility] NST [facility]  03/21/2021  2:15 PM Burleson, Rona Ravens, NP [facility]   Follow up Visit:  [place] for [place] at [facility] Follow up.   Specialty: Obstetrics and Gynecology Why: In 4-6 weeks for postpartum visit, Return to MAU as needed for emergencies Contact information: [place] [id number] [phone]               Message sent to [facility] by Dr. Cy Blamer on  10/3  Please schedule this patient for a In person postpartum visit in 4 weeks with the following provider: Any provider. Additional Postpartum F/U: None   Low risk pregnancy complicated by:  None Delivery mode:  Vaginal, Spontaneous  Anticipated Birth Control:  Unsure   02/14/2021 Fatima Blank, CNM

## 2021-02-13 NOTE — Progress Notes (Signed)
POSTPARTUM PROGRESS NOTE  Post Partum Day 1  Subjective:  Lisa Santiago is a 25 y.o. [id number] s/p SVD at [redacted]w[redacted]d.  She reports she is doing well. No acute events overnight. She denies any problems with ambulating, voiding or po intake. Denies nausea or vomiting.  Pain is well controlled.  Lochia is mild.  Brother provided interpretation.   Objective: Blood pressure 102/65, pulse 81, temperature 97.6 F (36.4 C), temperature source Oral, resp. rate 18, SpO2 100 %, unknown if currently breastfeeding.  Physical Exam:  General: alert, cooperative and no distress Chest: no respiratory distress Heart:regular rate, distal pulses intact Uterine Fundus: firm, appropriately tender DVT Evaluation: No calf swelling or tenderness Extremities: minimal edema Skin: warm, dry  Recent Labs    02/12/21 0037  HGB 13.2  HCT 39.3    Assessment/Plan: Lisa Santiago is a 25 y.o. I4P3295 s/p SVD at [redacted]w[redacted]d   PPD#1 - Doing well  Routine postpartum care  Contraception: Undecided Feeding: Breast Dispo: Plan for discharge tomorrow.   LOS: 2 days   [name], DO  OB Fellow  02/13/2021, 2:36 PM

## 2021-02-13 NOTE — Lactation Note (Signed)
This note was copied from a baby's chart. Lactation Consultation Note  Patient Name: Lisa Santiago Date: 02/13/2021 Reason for consult: Follow-up assessment;Term Age:23 hours   P2 mother whose infant is now 66 hours old.  This is a term baby at 39+5 weeks.  Mother breast fed her first child (now 44 years old) for 1 -1/2 yrs.  Mother's language is not available via the Ipad.  Her brother is here today and is her interpreter.  Baby has received a circumcision and is now in the nursery for his extra digit removal.  MD in room to explain procedure during my visit.  Discussed possible sleepiness after circumcision and informed mother that baby may awaken more during the night tonight to feed.  Mother had no questions/concerns related to breast feeding.  She denies pain with latching.  LATCH scores 7-8/ Baby is voiding/stooling well.  Mother has our OP phone number for any questions after discharge.  RN in room and updated.   Maternal Data    Feeding Mother's Current Feeding Choice: Breast Milk  LATCH Score Latch: Repeated attempts needed to sustain latch, nipple held in mouth throughout feeding, stimulation needed to elicit sucking reflex.  Audible Swallowing: A few with stimulation  Type of Nipple: Everted at rest and after stimulation  Comfort (Breast/Nipple): Soft / non-tender  Hold (Positioning): Assistance needed to correctly position infant at breast and maintain latch.  LATCH Score: 7   Lactation Tools Discussed/Used    Interventions    Discharge Discharge Education: Engorgement and breast care  Consult Status Consult Status: Complete Date: 02/13/21 Follow-up type: Call as needed    Caycee Wanat R Kippy Melena 02/13/2021, 11:47 AM

## 2021-02-14 MED ORDER — IBUPROFEN 600 MG PO TABS: 600.0000 mg | ORAL_TABLET | Freq: Four times a day (QID) | ORAL | 0 refills | Status: AC

## 2021-02-14 NOTE — Progress Notes (Signed)
Pt's history found to include [place] as a second language. [place] interpreter used via [place] and pt states she speaks very little french. Pt unable to comprehend questions asked via french interpreter.

## 2021-02-14 NOTE — Social Work (Signed)
CSW received consult for "Mother immigrated to [place] about 7 months ago. She does not speak [place] and family is interpreting for her because we do not have an interpreter available in her dialect." CSW met with MOB to offer support and complete assessment.    MOB Cousin at bedside to interpret.  CSW met with MOB at bedside and introduced CSW role. CSW congratulated MOB. CSW observed MOB breastfeeding the infant. MOB presented calm and receptive to [place] visit. CSW inquired how MOB has felt since giving birth. MOB reported she is doing a lot better today after a hard L&D.CSW inquired about MOB pregnancy. MOB reported feeling happy during the pregnancy. CSW inquired if MOB knew about experienced postpartum depression. MOB shared did not know about postpartum depression. CSW educated MOB about PPD and gave examples. MOB shared she did not experienced PPD with older child.  CSW provided education regarding the baby blues period vs. perinatal mood disorders, discussed treatment and gave resources for mental health follow up if concerns arise. CSW recommended MOB complete a self-evaluation during the postpartum time and encouraged MOB to contact a medical professional if symptoms are noted at any time. MOB shared her father, cousin or brother can assist her with reaching out to doctor if concerns arise. CSW assessed MOB for safety. MOB denied thoughts of harm to self and others. CSW inquired about MOB supports. MOB identified her father, brother, cousin and younger sister as supports. MOB shared her son is in [place] with her husband and she is looking forward to their arrival in the [place] soon.  CSW inquired if MOB has items for the infant. MOB shared she has clothes, diapers, wipes, and a car seat. MOB called her father to confirm the car seat. CSW spoke with maternal grandfather and confirmed he will bring the car seat today. Maternal grandfather confirmed MOB will have transportation to the infant's appointment at [facility] tomorrow at 8:30 am. MOB shared she does not have a place for the infant to sleep. CSW educated MOB about safe sleeping and provided review of Sudden Infant Death Syndrome (SIDS) precautions.  MOB reported understanding. CSW offered MOB a pack n play. MOB accepted. CSW inquired if MOB received WIC/FS. MOB shared she receives [facility]. CSW assessed MOB for additional needs. MOB reported no further needs.   -CSW delivered a Pack n Play.  CSW identifies no further need for intervention and no barriers to discharge at this time.    Kathrin Greathouse, MSW, LCSW Women's and [place] Worker  [phone] 02/14/2021  12:58 PM

## 2021-02-14 NOTE — Lactation Note (Signed)
This note was copied from a baby's chart. Lactation Consultation Note  Patient Name: Lisa Santiago MCNOB'S Date: 02/14/2021 Reason for consult: Follow-up assessment Age:23 hours  Cousin interpretering [place]. P1, Breasts are filling.  Mother's breasts were uncomfortable from filling last night so RN provided mother with heat and formula. Provided education regarding milk transitioning, breasts filling, engorgement care with cold, gentle masage and latching baby frequently to empty. Provided mother with hand pump.   Reviewed engorgement care and monitoring voids/stools. Observed feeding with frequent swallows.   Stools transitioning.  Feeding Mother's Current Feeding Choice: Breast Milk and Formula  LATCH Score Latch: Grasps breast easily, tongue down, lips flanged, rhythmical sucking.  Audible Swallowing: A few with stimulation  Type of Nipple: Everted at rest and after stimulation  Comfort (Breast/Nipple): Filling, red/small blisters or bruises, mild/mod discomfort  Hold (Positioning): Assistance needed to correctly position infant at breast and maintain latch.  LATCH Score: 7   Lactation Tools Discussed/Used  Manual pump  Interventions Interventions: Breast feeding basics reviewed;Assisted with latch;Skin to skin;Support pillows;Adjust position  Discharge Pump: Manual  Consult Status Consult Status: Follow-up Date: 02/15/21 Follow-up type: In-patient    Dahlia Byes [facility] 02/14/2021, 11:45 AM

## 2021-02-14 NOTE — Progress Notes (Signed)
Pt w/out support this evening. Attempted to find [place]ingo interpreter via [place] and language line, both resources did not have language. Attempted to call pt's father for interpreting assistance w/ no answer.

## 2021-02-18 ENCOUNTER — Other Ambulatory Visit: Payer: BC Managed Care – PPO

## 2021-02-21 ENCOUNTER — Inpatient Hospital Stay (HOSPITAL_COMMUNITY): Payer: BC Managed Care – PPO

## 2021-02-23 ENCOUNTER — Telehealth (HOSPITAL_COMMUNITY): Payer: Self-pay | Admitting: *Deleted

## 2021-02-23 NOTE — Telephone Encounter (Signed)
Interpreter left message to return nurse call if concerns about herself or baby.  Duffy Rhody, RN 02-23-2021 at 2:56pm

## 2021-03-21 ENCOUNTER — Ambulatory Visit (INDEPENDENT_AMBULATORY_CARE_PROVIDER_SITE_OTHER): Payer: BC Managed Care – PPO | Admitting: Nurse Practitioner

## 2021-03-21 ENCOUNTER — Encounter: Payer: Self-pay | Admitting: Nurse Practitioner

## 2021-03-21 ENCOUNTER — Other Ambulatory Visit: Payer: Self-pay

## 2021-03-21 DIAGNOSIS — Z789 Other specified health status: Secondary | ICD-10-CM

## 2021-03-21 DIAGNOSIS — R519 Headache, unspecified: Secondary | ICD-10-CM

## 2021-03-21 NOTE — Progress Notes (Signed)
Post Partum Visit Note No interpreter available for her language - her father interprets and was in the room the entire visit except when vulva visualized.  Lisa Santiago is a 25 y.o. G42P2002 female who presents for a postpartum visit. She is 5 weeks 2 days postpartum following a normal spontaneous vaginal delivery.  I have fully reviewed the prenatal and intrapartum course. The delivery was at 39w 5d.  She delivered in MAU.  On arrival was complete and pushing.  Anesthesia:  Lidocaine for repair . Postpartum course has been good. Baby is doing well. Baby is feeding by both breast and bottle - cannot recall formula type, from [facility] . Bleeding staining only. Bowel function is normal. Bladder function is normal. Patient is not sexually active. Contraception method is none. Postpartum depression screening: negative.   The pregnancy intention screening data noted above was reviewed. Potential methods of contraception were discussed. The patient elected to proceed with no method as her partner is out of the country and no specific date to return.   Edinburgh Postnatal Depression Scale - 03/21/21 1441       Edinburgh Postnatal Depression Scale:  In the Past 7 Days   I have been able to laugh and see the funny side of things. 0    I have looked forward with enjoyment to things. 0    I have blamed myself unnecessarily when things went wrong. 0    I have been anxious or worried for no good reason. 0    I have felt scared or panicky for no good reason. 0    Things have been getting on top of me. 0    I have been so unhappy that I have had difficulty sleeping. 0    I have felt sad or miserable. 0    I have been so unhappy that I have been crying. 0    The thought of harming myself has occurred to me. 0    Edinburgh Postnatal Depression Scale Total 0             Health Maintenance Due  Topic Date Due   HPV VACCINES (1 - 2-dose series) Never done    The following portions of the patient's  history were reviewed and updated as appropriate: allergies, current medications, past family history, past medical history, past social history, past surgical history, and problem list.  Review of Systems Pertinent items noted in HPI and remainder of comprehensive ROS otherwise negative.  Objective:  BP 124/75   Pulse 68   Wt 160 lb (72.6 kg)   Breastfeeding Yes   BMI 29.26 kg/m    General:  alert, cooperative, and no distress   Breasts:  not indicated  Lungs: clear to auscultation bilaterally  Heart:  regular rate and rhythm, S1, S2 normal, no murmur, click, rub or gallop  Abdomen: Soft, nontender, no masses    Wound  Well healed incision.  No stitches seen.  Has a small tag of internal vaginal tissue that protrudes out of introitus approx 0.25 to 0.5 cm  GU exam:  not indicated       Assessment:    Normal postpartum exam.   Plan:   Essential components of care per ACOG recommendations:  1.  Mood and well being: Patient with negative depression screening today. Reviewed local resources for support.  - Patient tobacco use? No.   - hx of drug use? No.    2. Infant care and feeding:  -Patient currently  breastmilk feeding? No.  -Social determinants of health (SDOH) reviewed in EPIC. No concerns  3. Sexuality, contraception and birth spacing - Patient does not want a pregnancy in the next year.   - Reviewed forms of contraception in tiered fashion. Patient desired no method today.  Partner is in [place] with no date to visit again.  Advised to come for contraception before having intercourse. - Discussed birth spacing of 18 months  4. Sleep and fatigue -Encouraged family/partner/community support of 4 hrs of uninterrupted sleep to help with mood and fatigue  5. Physical Recovery  - Discussed patients delivery and complications. She describes her labor as good. - Patient had a Vaginal, no problems at delivery. Patient had a 2nd degree laceration. Perineal healing reviewed.  Patient expressed understanding - Patient has urinary incontinence? No. - Patient is safe to resume physical and sexual activity  6.  Health Maintenance - HM due items addressed Yes - Last pap smear  Diagnosis  Date Value Ref Range Status  10/14/2020   Final   - Negative for intraepithelial lesion or malignancy (NILM)   Pap smear not done at today's visit.  -Breast Cancer screening indicated? No.   7. Chronic Disease/Pregnancy Condition follow up: None  - PCP follow up as needed Having headaches and reviewed having 64 ounces of fluid daily. Eating every 4 hours. May take tylenol for headache - helping so far.  Advised she can also try Excedrin by the package directions for headaches if needed. Was worried that vaginal stitches would need to be removed.  Reassured stitches will dissolve and removal is not needed.  Currie Paris, NP [facility] [employer], [facility]

## 2021-03-21 NOTE — Patient Instructions (Signed)
Excedrin can also be used for headaches.  Take by the package directions.

## 2021-04-14 DIAGNOSIS — Z419 Encounter for procedure for purposes other than remedying health state, unspecified: Secondary | ICD-10-CM | POA: Diagnosis not present

## 2021-04-22 DIAGNOSIS — K2100 Gastro-esophageal reflux disease with esophagitis, without bleeding: Secondary | ICD-10-CM | POA: Diagnosis not present

## 2021-05-15 DIAGNOSIS — Z419 Encounter for procedure for purposes other than remedying health state, unspecified: Secondary | ICD-10-CM | POA: Diagnosis not present

## 2021-06-15 DIAGNOSIS — Z419 Encounter for procedure for purposes other than remedying health state, unspecified: Secondary | ICD-10-CM | POA: Diagnosis not present

## 2021-07-13 DIAGNOSIS — Z419 Encounter for procedure for purposes other than remedying health state, unspecified: Secondary | ICD-10-CM | POA: Diagnosis not present

## 2021-08-13 DIAGNOSIS — Z419 Encounter for procedure for purposes other than remedying health state, unspecified: Secondary | ICD-10-CM | POA: Diagnosis not present

## 2021-09-12 DIAGNOSIS — Z419 Encounter for procedure for purposes other than remedying health state, unspecified: Secondary | ICD-10-CM | POA: Diagnosis not present

## 2021-10-13 DIAGNOSIS — Z419 Encounter for procedure for purposes other than remedying health state, unspecified: Secondary | ICD-10-CM | POA: Diagnosis not present

## 2021-11-12 DIAGNOSIS — Z419 Encounter for procedure for purposes other than remedying health state, unspecified: Secondary | ICD-10-CM | POA: Diagnosis not present

## 2021-12-13 DIAGNOSIS — Z419 Encounter for procedure for purposes other than remedying health state, unspecified: Secondary | ICD-10-CM | POA: Diagnosis not present

## 2022-01-13 DIAGNOSIS — Z419 Encounter for procedure for purposes other than remedying health state, unspecified: Secondary | ICD-10-CM | POA: Diagnosis not present

## 2022-02-12 DIAGNOSIS — Z419 Encounter for procedure for purposes other than remedying health state, unspecified: Secondary | ICD-10-CM | POA: Diagnosis not present

## 2022-04-21 ENCOUNTER — Other Ambulatory Visit: Payer: Self-pay | Admitting: Internal Medicine

## 2022-04-21 DIAGNOSIS — Z131 Encounter for screening for diabetes mellitus: Secondary | ICD-10-CM | POA: Diagnosis not present

## 2022-04-21 DIAGNOSIS — E559 Vitamin D deficiency, unspecified: Secondary | ICD-10-CM | POA: Diagnosis not present

## 2022-04-21 DIAGNOSIS — Z0000 Encounter for general adult medical examination without abnormal findings: Secondary | ICD-10-CM | POA: Diagnosis not present

## 2022-04-21 DIAGNOSIS — N760 Acute vaginitis: Secondary | ICD-10-CM | POA: Diagnosis not present

## 2022-04-21 DIAGNOSIS — Z13220 Encounter for screening for lipoid disorders: Secondary | ICD-10-CM | POA: Diagnosis not present

## 2022-04-22 LAB — CBC
HCT: 42.9 % (ref 35.0–45.0)
Hemoglobin: 13.9 g/dL (ref 11.7–15.5)
MCH: 23.4 pg — ABNORMAL LOW (ref 27.0–33.0)
MCHC: 32.4 g/dL (ref 32.0–36.0)
MCV: 72.3 fL — ABNORMAL LOW (ref 80.0–100.0)
MPV: 11.1 fL (ref 7.5–12.5)
Platelets: 373 10*3/uL (ref 140–400)
RBC: 5.93 10*6/uL — ABNORMAL HIGH (ref 3.80–5.10)
RDW: 14.4 % (ref 11.0–15.0)
WBC: 5.3 10*3/uL (ref 3.8–10.8)

## 2022-04-22 LAB — COMPLETE METABOLIC PANEL WITH GFR
AG Ratio: 1.6 (calc) (ref 1.0–2.5)
ALT: 12 U/L (ref 6–29)
AST: 19 U/L (ref 10–30)
Albumin: 4.7 g/dL (ref 3.6–5.1)
Alkaline phosphatase (APISO): 77 U/L (ref 31–125)
BUN: 10 mg/dL (ref 7–25)
CO2: 22 mmol/L (ref 20–32)
Calcium: 9.2 mg/dL (ref 8.6–10.2)
Chloride: 103 mmol/L (ref 98–110)
Creat: 0.61 mg/dL (ref 0.50–0.96)
Globulin: 2.9 g/dL (calc) (ref 1.9–3.7)
Glucose, Bld: 87 mg/dL (ref 65–99)
Potassium: 4.4 mmol/L (ref 3.5–5.3)
Sodium: 136 mmol/L (ref 135–146)
Total Bilirubin: 0.8 mg/dL (ref 0.2–1.2)
Total Protein: 7.6 g/dL (ref 6.1–8.1)
eGFR: 128 mL/min/{1.73_m2} (ref >=60)

## 2022-04-22 LAB — LIPID PANEL
Cholesterol: 177 mg/dL (ref <200)
HDL: 54 mg/dL (ref >=50)
LDL Cholesterol (Calc): 108 mg/dL (calc) — ABNORMAL HIGH
Non-HDL Cholesterol (Calc): 123 mg/dL (calc) (ref <130)
Total CHOL/HDL Ratio: 3.3 (calc) (ref <5.0)
Triglycerides: 61 mg/dL (ref <150)

## 2022-04-22 LAB — TIQ-NTM

## 2022-04-22 LAB — VITAMIN D 25 HYDROXY (VIT D DEFICIENCY, FRACTURES): Vit D, 25-Hydroxy: 17 ng/mL — ABNORMAL LOW (ref 30–100)

## 2022-05-22 DIAGNOSIS — N760 Acute vaginitis: Secondary | ICD-10-CM | POA: Diagnosis not present

## 2022-05-22 DIAGNOSIS — K219 Gastro-esophageal reflux disease without esophagitis: Secondary | ICD-10-CM | POA: Diagnosis not present

## 2022-05-22 DIAGNOSIS — E559 Vitamin D deficiency, unspecified: Secondary | ICD-10-CM | POA: Diagnosis not present

## 2022-10-16 ENCOUNTER — Other Ambulatory Visit: Payer: Self-pay | Admitting: Internal Medicine

## 2022-10-16 DIAGNOSIS — E559 Vitamin D deficiency, unspecified: Secondary | ICD-10-CM | POA: Diagnosis not present

## 2022-10-16 DIAGNOSIS — Z418 Encounter for other procedures for purposes other than remedying health state: Secondary | ICD-10-CM | POA: Diagnosis not present

## 2022-10-16 DIAGNOSIS — K219 Gastro-esophageal reflux disease without esophagitis: Secondary | ICD-10-CM | POA: Diagnosis not present

## 2022-10-16 DIAGNOSIS — N760 Acute vaginitis: Secondary | ICD-10-CM | POA: Diagnosis not present

## 2022-10-17 LAB — C. TRACHOMATIS/N. GONORRHOEAE RNA
C. trachomatis RNA, TMA: NOT DETECTED
N. gonorrhoeae RNA, TMA: NOT DETECTED

## 2023-05-02 DIAGNOSIS — Z131 Encounter for screening for diabetes mellitus: Secondary | ICD-10-CM | POA: Diagnosis not present

## 2023-05-02 DIAGNOSIS — K219 Gastro-esophageal reflux disease without esophagitis: Secondary | ICD-10-CM | POA: Diagnosis not present

## 2023-05-02 DIAGNOSIS — N390 Urinary tract infection, site not specified: Secondary | ICD-10-CM | POA: Diagnosis not present

## 2023-05-02 DIAGNOSIS — E559 Vitamin D deficiency, unspecified: Secondary | ICD-10-CM | POA: Diagnosis not present

## 2023-05-02 DIAGNOSIS — Z13220 Encounter for screening for lipoid disorders: Secondary | ICD-10-CM | POA: Diagnosis not present

## 2023-05-02 DIAGNOSIS — Z0000 Encounter for general adult medical examination without abnormal findings: Secondary | ICD-10-CM | POA: Diagnosis not present

## 2023-05-02 DIAGNOSIS — L2089 Other atopic dermatitis: Secondary | ICD-10-CM | POA: Diagnosis not present

## 2023-05-19 ENCOUNTER — Emergency Department (HOSPITAL_COMMUNITY): Payer: BC Managed Care – PPO

## 2023-05-19 ENCOUNTER — Emergency Department (HOSPITAL_COMMUNITY)
Admission: EM | Admit: 2023-05-19 | Discharge: 2023-05-19 | Disposition: A | Discharge status: Home / Self Care | Payer: BC Managed Care – PPO | Attending: Emergency Medicine | Admitting: Emergency Medicine

## 2023-05-19 ENCOUNTER — Encounter (HOSPITAL_COMMUNITY): Payer: Self-pay | Admitting: *Deleted

## 2023-05-19 ENCOUNTER — Other Ambulatory Visit: Payer: Self-pay

## 2023-05-19 DIAGNOSIS — S00531A Contusion of lip, initial encounter: Secondary | ICD-10-CM

## 2023-05-19 DIAGNOSIS — S01511A Laceration without foreign body of lip, initial encounter: Secondary | ICD-10-CM | POA: Insufficient documentation

## 2023-05-19 DIAGNOSIS — S0990XA Unspecified injury of head, initial encounter: Secondary | ICD-10-CM | POA: Diagnosis present

## 2023-05-19 DIAGNOSIS — Y92410 Unspecified street and highway as the place of occurrence of the external cause: Secondary | ICD-10-CM | POA: Insufficient documentation

## 2023-05-19 DIAGNOSIS — S0083XA Contusion of other part of head, initial encounter: Secondary | ICD-10-CM
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
## 2023-07-30 ENCOUNTER — Encounter (HOSPITAL_COMMUNITY): Payer: Self-pay

## 2023-07-30 ENCOUNTER — Other Ambulatory Visit: Payer: Self-pay

## 2023-07-30 ENCOUNTER — Emergency Department (HOSPITAL_COMMUNITY)

## 2023-07-30 ENCOUNTER — Emergency Department (HOSPITAL_COMMUNITY)
Admission: EM | Admit: 2023-07-30 | Discharge: 2023-07-31 | Disposition: A | Discharge status: Home / Self Care | Attending: Emergency Medicine | Admitting: Emergency Medicine

## 2023-07-30 DIAGNOSIS — R000 Tachycardia, unspecified: Secondary | ICD-10-CM | POA: Diagnosis present

## 2023-07-30 DIAGNOSIS — R079 Chest pain, unspecified: Secondary | ICD-10-CM | POA: Insufficient documentation

## 2023-07-30 DIAGNOSIS — R002 Palpitations: Secondary | ICD-10-CM | POA: Diagnosis not present

## 2023-07-30 LAB — BASIC METABOLIC PANEL
Anion gap: 14 (ref 5–15)
BUN: 7 mg/dL (ref 6–20)
CO2: 20 mmol/L — ABNORMAL LOW (ref 22–32)
Calcium: 9.3 mg/dL (ref 8.9–10.3)
Chloride: 102 mmol/L (ref 98–111)
Creatinine, Ser: 0.75 mg/dL (ref 0.44–1.00)
GFR, Estimated: >60 mL/min (ref >60)
Glucose, Bld: 95 mg/dL (ref 70–99)
Potassium: 3.7 mmol/L (ref 3.5–5.1)
Sodium: 136 mmol/L (ref 135–145)

## 2023-07-30 LAB — CBC
HCT: 43.5 % (ref 36.0–46.0)
Hemoglobin: 14.7 g/dL (ref 12.0–15.0)
MCH: 23.9 pg — ABNORMAL LOW (ref 26.0–34.0)
MCHC: 33.8 g/dL (ref 30.0–36.0)
MCV: 70.7 fL — ABNORMAL LOW (ref 80.0–100.0)
Platelets: 391 10*3/uL (ref 150–400)
RBC: 6.15 MIL/uL — ABNORMAL HIGH (ref 3.87–5.11)
RDW: 13.2 % (ref 11.5–15.5)
WBC: 7.8 10*3/uL (ref 4.0–10.5)
nRBC: 0 % (ref 0.0–0.2)

## 2023-07-30 LAB — TSH: TSH: 0.416 u[IU]/mL (ref 0.350–4.500)

## 2023-07-30 LAB — TROPONIN I (HIGH SENSITIVITY)
Troponin I (High Sensitivity): <2 ng/L (ref <18)
Troponin I (High Sensitivity): <2 ng/L (ref <18)

## 2023-07-30 LAB — HCG, SERUM, QUALITATIVE: Preg, Serum: NEGATIVE

## 2023-07-30 LAB — T4, FREE: Free T4: 1.1 ng/dL (ref 0.61–1.12)

## 2023-07-30 LAB — D-DIMER, QUANTITATIVE: D-Dimer, Quant: <0.27 ug{FEU}/mL (ref 0.00–0.50)

## 2023-07-30 MED ORDER — SODIUM CHLORIDE 0.9 % IV BOLUS
1000.0000 mL | Freq: Once | INTRAVENOUS | Status: AC
  Administered 2023-07-30: 1000 mL via INTRAVENOUS

## 2023-07-30 NOTE — ED Provider Notes (Signed)
 [place] EMERGENCY DEPARTMENT AT [facility] Provider Note  CSN: [id number] Arrival date & time: 07/30/23 1720  Chief Complaint(s) Chest Pain  HPI Lisa Santiago is a 28 y.o. female with past medical history as below, significant for palpitations, gerd who presents to the ED with complaint of rapid hr  Patient was seen earlier in urgent care, sent to the ER for evaluation with concern to elevated heart rate, possible thyroid abnormality.  Patient reports palpitations intermittently over the past few weeks, also reports being very anxious when in healthcare settings.  She has some occasional chest pain, she can feel the palpitations when they come on.  She has occasional indigestion like sensation, no nausea or vomiting.  No chest pain currently.  No fevers or chills, no syncope, no change in bowel or bladder function.  Denies use of illicit drugs.  No excessive stimulant use.  Past Medical History Past Medical History:  Diagnosis Date   Medical history non-contributory    Patient Active Problem List   Diagnosis Date Noted   Language barrier 11/16/2020   Home Medication(s) Prior to Admission medications   Medication Sig Start Date End Date Taking? Authorizing Provider  ibuprofen (ADVIL) 600 MG tablet Take 1 tablet (600 mg total) by mouth every 6 (six) hours. 02/14/21   Leftwich-Kirby, Wilmer Floor, CNM  Prenatal Vit-Fe Fumarate-FA (PREPLUS) 27-1 MG TABS Take by mouth.    [provider]                                                                                                                                    Past Surgical History Past Surgical History:  Procedure Laterality Date   NO PAST SURGERIES     Family History Family History  Problem Relation Age of Onset   Healthy Mother    Hypertension Father     Social History Social History   Tobacco Use   Smoking status: Never   Smokeless tobacco: Never  Vaping Use   Vaping status: Never Used  Substance  Use Topics   Alcohol use: Not Currently   Drug use: Never   Allergies Ibuprofen  Review of Systems A thorough review of systems was obtained and all systems are negative except as noted in the HPI and PMH.   Physical Exam Vital Signs  I have reviewed the triage vital signs BP (!) 141/81 (BP Location: Right Arm)   Pulse (!) 122   Temp 98.4 F (36.9 C)   Resp 16   Ht 5\' 6"  (1.676 m)   Wt 74.4 kg   LMP 07/24/2023   SpO2 100%   BMI 26.47 kg/m  Physical Exam Vitals and nursing note reviewed.  Constitutional:      General: She is not in acute distress.    Appearance: Normal appearance.  HENT:     Head: Normocephalic and atraumatic.     Right Ear: External ear normal.  Left Ear: External ear normal.     Nose: Nose normal.     Mouth/Throat:     Mouth: Mucous membranes are moist.  Eyes:     General: No scleral icterus.       Right eye: No discharge.        Left eye: No discharge.  Cardiovascular:     Rate and Rhythm: Regular rhythm. Tachycardia present.     Pulses: Normal pulses.     Heart sounds: Normal heart sounds.  Pulmonary:     Effort: Pulmonary effort is normal. No respiratory distress.     Breath sounds: Normal breath sounds. No stridor.  Abdominal:     General: Abdomen is flat. There is no distension.     Palpations: Abdomen is soft.     Tenderness: There is no abdominal tenderness.  Musculoskeletal:     Cervical back: No rigidity.     Right lower leg: No edema.     Left lower leg: No edema.  Skin:    General: Skin is warm and dry.     Capillary Refill: Capillary refill takes less than 2 seconds.  Neurological:     Mental Status: She is alert.  Psychiatric:        Mood and Affect: Mood normal.        Behavior: Behavior normal. Behavior is cooperative.     ED Results and Treatments Labs (all labs ordered are listed, but only abnormal results are displayed) Labs Reviewed  BASIC METABOLIC PANEL - Abnormal; Notable for the following components:       Result Value   CO2 20 (*)    All other components within normal limits  CBC - Abnormal; Notable for the following components:   RBC 6.15 (*)    MCV 70.7 (*)    MCH 23.9 (*)    All other components within normal limits  HCG, SERUM, QUALITATIVE  TSH  T4, FREE  TROPONIN I (HIGH SENSITIVITY)  TROPONIN I (HIGH SENSITIVITY)                                                                                                                          Radiology DG Chest 2 View Result Date: 07/30/2023 CLINICAL DATA:  Chest pain EXAM: CHEST - 2 VIEW COMPARISON:  None Available. FINDINGS: The heart size and mediastinal contours are within normal limits. Both lungs are clear. The visualized skeletal structures are unremarkable. IMPRESSION: No active cardiopulmonary disease. Electronically Signed   By: Sharlet Salina M.D.   On: 07/30/2023 19:58    Pertinent labs & imaging results that were available during my care of the patient were reviewed by me and considered in my medical decision making (see MDM for details).  Medications Ordered in ED Medications  sodium chloride 0.9 % bolus 1,000 mL (1,000 mLs Intravenous New Bag/Given 07/30/23 2159)  Procedures Procedures  (including critical care time)  Medical Decision Making / ED Course    Medical Decision Making:    Lisa Santiago is a 28 y.o. female with past medical history as below, significant for palpitations, gerd who presents to the ED with complaint of rapid hr. The complaint involves an extensive differential diagnosis and also carries with it a high risk of complications and morbidity.  Serious etiology was considered. Ddx includes but is not limited to: Thyroid abnormality, infection, dehydration, metabolic derangement, PE, psychosomatic, ACS, etc.  Complete initial physical exam performed, notably the patient  was in no acute distress, heart rate elevated.    Reviewed and confirmed nursing documentation for past medical history, family history, social history.  Vital signs reviewed.        Brief summary: 28 year old female history as above here with elevated heart rate from urgent care. Reviewed ER visit from 1/4, patient heart rate was 109 - 115.  During admission 2022 heart rate was >90  She is having some palpitations. Labs ordered in triage, TSH and T4 within normal limits.  BMP and CBC are also stable.  Pregnancy was negative.  Troponin negative x2.  EKG shows sinus tachycardia. Her Wells score is low, she has no significant dyspnea.  Will add on D-dimer.                Additional history obtained: -Additional history obtained from na -External records from outside source obtained and reviewed including: Chart review including previous notes, labs, imaging, consultation notes including  Prior ER evaluation, home medications, urgent care evaluation earlier today   Lab Tests: -I ordered, reviewed, and interpreted labs.   The pertinent results include:   Labs Reviewed  BASIC METABOLIC PANEL - Abnormal; Notable for the following components:      Result Value   CO2 20 (*)    All other components within normal limits  CBC - Abnormal; Notable for the following components:   RBC 6.15 (*)    MCV 70.7 (*)    MCH 23.9 (*)    All other components within normal limits  HCG, SERUM, QUALITATIVE  TSH  T4, FREE  TROPONIN I (HIGH SENSITIVITY)  TROPONIN I (HIGH SENSITIVITY)    Notable for ***  EKG   EKG Interpretation Date/Time:  Monday July 30 2023 18:05:47 EDT Ventricular Rate:  130 PR Interval:  160 QRS Duration:  74 QT Interval:  270 QTC Calculation: 397 R Axis:   40  Text Interpretation: Sinus tachycardia T wave abnormality, consider inferior ischemia Abnormal ECG No previous ECGs available Confirmed by Tanda Rockers ([id number]) on 07/30/2023 9:34:24 PM          Imaging Studies ordered: I ordered imaging studies including CXR I independently visualized the following imaging with scope of interpretation limited to determining acute life threatening conditions related to emergency care; findings noted above I independently visualized and interpreted imaging. I agree with the radiologist interpretation   Medicines ordered and prescription drug management: Meds ordered this encounter  Medications   sodium chloride 0.9 % bolus 1,000 mL    -I have reviewed the patients home medicines and have made adjustments as needed   Consultations Obtained: na   Cardiac Monitoring: The patient was maintained on a cardiac monitor.  I personally viewed and interpreted the cardiac monitored which showed an underlying rhythm of: sinus tachy Continuous pulse oximetry interpreted by myself, 100% on RA.    Social Determinants of Health:  Diagnosis or treatment significantly limited  by social determinants of health: does not speak much english    Reevaluation: After the interventions noted above, I reevaluated the patient and found that they have improved  Co morbidities that complicate the patient evaluation  Past Medical History:  Diagnosis Date   Medical history non-contributory       Dispostion: Disposition decision including need for hospitalization was considered, and patient {[id number]::"discharged from emergency department."}    Final Clinical Impression(s) / ED Diagnoses Final diagnoses:  None

## 2023-07-30 NOTE — Discharge Instructions (Addendum)
 It was a pleasure caring for you today in the emergency department.  Recommend you get the rest over the next few days, avoid stimulants such as caffeine.  Follow-up with cardiology in the office for follow-up.  You will likely benefit from a cardiac monitor that you can wear on your chest to monitor your heart rate remotely before we decide to start you on medication to treat your elevated heart rate.  Your thyroid tests were normal today   Please return to the emergency department for any worsening or worrisome symptoms.    Ce fut un plaisir de vous soigner aujourd'hui [employer].  Nous vous recommandons de vous reposer les prochains jours et d'viter les stimulants comme la cafine. Consultez votre cardiologue au cabinet pour un suivi. Un moniteur cardiaque, que vous pourrez porter sur votre poitrine, vous permettra de Chief Financial Officer frquence cardiaque  distance avant que nous dcidions de vous prescrire un traitement mdicamenteux pour votre hypertension.  Vos analyses thyrodiennes taient normales aujourd'hui.  Veuillez revenir aux urgences en cas d'aggravation ou de symptmes inquitants.

## 2023-07-30 NOTE — ED Provider Triage Note (Signed)
 Emergency Medicine Provider Triage Evaluation Note  Lisa Santiago , a 28 y.o. female  was evaluated in triage.  Pt complains of chest pain, heart palpitations, shortness of breath. Sent from urgent care. Recently moved from [place]. Concern for thyroid disease with thyroid enlargement noted.  Review of Systems  Positive: Chest pain, palpitations, shob Negative: fever  Physical Exam  BP (!) 156/99 (BP Location: Right Wrist)   Pulse (!) 134   Temp 98.6 F (37 C)   Resp 15   SpO2 100%  Gen:   Awake, no distress   Resp:  Normal effort  MSK:   Moves extremities without difficulty  Other:  Enlarged thyroid Tachycardia, normal rhythm  Medical Decision Making  Medically screening exam initiated at 6:11 PM.  Appropriate orders placed.  Lisa Santiago was informed that the remainder of the evaluation will be completed by another provider, this initial triage assessment does not replace that evaluation, and the importance of remaining in the ED until their evaluation is complete.  Workup initiated in triage    Olene Floss, [place] 07/30/23 1813

## 2023-07-30 NOTE — ED Notes (Signed)
 PT is c/o of being cold and is visibly shaking. Given 2 warm blankets

## 2023-07-30 NOTE — ED Triage Notes (Signed)
 Pt presents with a referral from [facility]. Pt with c/o intermittent CP, palpitations, dizziness for 2 weeks. She has been having heat and cold intolerance. Pt was also noted to have an enlarged thyroid on their exam.   [employer] was unable to find an interpreter and pt wanted [place] to use a family member on the phone to interpret for her.

## 2023-07-31 NOTE — ED Notes (Signed)
 ..  The patient is A&OX4, ambulatory at d/c with independent steady gait, NAD. Pt verbalized understanding of d/c instructions and follow up care.

## 2023-08-01 ENCOUNTER — Other Ambulatory Visit: Payer: Self-pay

## 2023-08-01 ENCOUNTER — Emergency Department (HOSPITAL_COMMUNITY)
Admission: EM | Admit: 2023-08-01 | Discharge: 2023-08-01 | Disposition: A | Discharge status: Home / Self Care | Attending: Emergency Medicine | Admitting: Emergency Medicine

## 2023-08-01 ENCOUNTER — Encounter (HOSPITAL_COMMUNITY): Payer: Self-pay

## 2023-08-01 ENCOUNTER — Emergency Department (HOSPITAL_COMMUNITY)

## 2023-08-01 DIAGNOSIS — F419 Anxiety disorder, unspecified: Secondary | ICD-10-CM | POA: Diagnosis not present

## 2023-08-01 DIAGNOSIS — E876 Hypokalemia: Secondary | ICD-10-CM | POA: Diagnosis not present

## 2023-08-01 DIAGNOSIS — R002 Palpitations: Secondary | ICD-10-CM | POA: Diagnosis present

## 2023-08-01 DIAGNOSIS — R0789 Other chest pain: Secondary | ICD-10-CM | POA: Insufficient documentation

## 2023-08-01 DIAGNOSIS — R519 Headache, unspecified: Secondary | ICD-10-CM | POA: Insufficient documentation

## 2023-08-01 LAB — BASIC METABOLIC PANEL
Anion gap: 7 (ref 5–15)
BUN: 5 mg/dL — ABNORMAL LOW (ref 6–20)
CO2: 28 mmol/L (ref 22–32)
Calcium: 9.6 mg/dL (ref 8.9–10.3)
Chloride: 103 mmol/L (ref 98–111)
Creatinine, Ser: 0.67 mg/dL (ref 0.44–1.00)
GFR, Estimated: >60 mL/min (ref >60)
Glucose, Bld: 117 mg/dL — ABNORMAL HIGH (ref 70–99)
Potassium: 3.2 mmol/L — ABNORMAL LOW (ref 3.5–5.1)
Sodium: 138 mmol/L (ref 135–145)

## 2023-08-01 LAB — CBC
HCT: 42.6 % (ref 36.0–46.0)
Hemoglobin: 14.2 g/dL (ref 12.0–15.0)
MCH: 23.5 pg — ABNORMAL LOW (ref 26.0–34.0)
MCHC: 33.3 g/dL (ref 30.0–36.0)
MCV: 70.5 fL — ABNORMAL LOW (ref 80.0–100.0)
Platelets: 368 10*3/uL (ref 150–400)
RBC: 6.04 MIL/uL — ABNORMAL HIGH (ref 3.87–5.11)
RDW: 13.2 % (ref 11.5–15.5)
WBC: 7 10*3/uL (ref 4.0–10.5)
nRBC: 0 % (ref 0.0–0.2)

## 2023-08-01 LAB — TROPONIN I (HIGH SENSITIVITY)
Troponin I (High Sensitivity): <2 ng/L (ref <18)
Troponin I (High Sensitivity): <2 ng/L (ref <18)

## 2023-08-01 LAB — HCG, SERUM, QUALITATIVE: Preg, Serum: NEGATIVE

## 2023-08-01 MED ORDER — POTASSIUM CHLORIDE CRYS ER 20 MEQ PO TBCR
40.0000 meq | EXTENDED_RELEASE_TABLET | Freq: Once | ORAL | Status: AC
  Administered 2023-08-01: 40 meq via ORAL
  Filled 2023-08-01: qty 2

## 2023-08-01 NOTE — ED Triage Notes (Addendum)
 Pt refuses to have interpreter, requests that her brother who is on the phone translate for her. Pt reports chest pain that started around 0500 this morning. She also reports headache, confusion, nausea, and vomiting as well. Pt is AxOx4. Rates the chest pain as 10/10. Pt reports she was seen two days ago for similar symptoms and reported she believes she has been having panic attacks.

## 2023-08-01 NOTE — ED Provider Notes (Signed)
 [place] EMERGENCY DEPARTMENT AT [facility] Provider Note   CSN: [id number] Arrival date & time: 08/01/23  0865     History  Chief Complaint  Patient presents with   Chest Pain   Panic Attack    Lisa Santiago is a 28 y.o. female patient with past medical history of sinus tachycardia is presenting to emergency room with complaint of palpitations.  Patient reports her palpitations started this morning around 5 AM and resolved her to being evaluated by me.  She reports this started Saturday and she has already been evaluated in the emergency room for similar thing.  Patient reports that the palpitations are intermittent.  Reports that will last minutes to hours.  She reports that she gets very nervous and feels that being in the hospital makes this worse.  Patient reports when palpitations were present she felt very anxious, had mild chest pain and had a headache.  She is currently symptom-free.  Denies any chest pain.  Denies any presyncope, syncope, shortness of breath, dizziness or lightheadedness. Denies drug use.   Printer was offered and used.  Chest Pain      Home Medications Prior to Admission medications   Medication Sig Start Date End Date Taking? Authorizing Provider  ibuprofen (ADVIL) 600 MG tablet Take 1 tablet (600 mg total) by mouth every 6 (six) hours. 02/14/21   Leftwich-Kirby, Wilmer Floor, CNM  Prenatal Vit-Fe Fumarate-FA (PREPLUS) 27-1 MG TABS Take by mouth.    [provider]      Allergies    Ibuprofen    Review of Systems   Review of Systems  Cardiovascular:  Positive for chest pain.    Physical Exam Updated Vital Signs BP 132/85 (BP Location: Left Arm)   Pulse 99   Temp 97.9 F (36.6 C) (Oral)   Resp 17   LMP 07/24/2023   SpO2 99%  Physical Exam Vitals and nursing note reviewed.  Constitutional:      General: She is not in acute distress.    Appearance: She is not toxic-appearing.  HENT:     Head: Normocephalic and  atraumatic.  Eyes:     General: No scleral icterus.    Conjunctiva/sclera: Conjunctivae normal.  Cardiovascular:     Rate and Rhythm: Normal rate and regular rhythm.     Pulses: Normal pulses.     Heart sounds: Normal heart sounds.  Pulmonary:     Effort: Pulmonary effort is normal. No respiratory distress.     Breath sounds: Normal breath sounds.  Abdominal:     General: Abdomen is flat. Bowel sounds are normal.     Palpations: Abdomen is soft.     Tenderness: There is no abdominal tenderness.  Musculoskeletal:     Right lower leg: No edema.     Left lower leg: No edema.  Skin:    General: Skin is warm and dry.     Findings: No lesion.  Neurological:     General: No focal deficit present.     Mental Status: She is alert and oriented to person, place, and time. Mental status is at baseline.     ED Results / Procedures / Treatments   Labs (all labs ordered are listed, but only abnormal results are displayed) Labs Reviewed  BASIC METABOLIC PANEL - Abnormal; Notable for the following components:      Result Value   Potassium 3.2 (*)    Glucose, Bld 117 (*)    BUN 5 (*)  All other components within normal limits  CBC - Abnormal; Notable for the following components:   RBC 6.04 (*)    MCV 70.5 (*)    MCH 23.5 (*)    All other components within normal limits  HCG, SERUM, QUALITATIVE  TROPONIN I (HIGH SENSITIVITY)  TROPONIN I (HIGH SENSITIVITY)    EKG EKG Interpretation Date/Time:  Wednesday August 01 2023 18:35:36 EDT Ventricular Rate:  105 PR Interval:  126 QRS Duration:  76 QT Interval:  318 QTC Calculation: 420 R Axis:   58  Text Interpretation: Sinus tachycardia Nonspecific T wave abnormality Abnormal ECG When compared with ECG of 30-Jul-2023 18:05, No significant change since last tracing Confirmed by Benjiman Core [phone]) on 08/01/2023 6:37:55 PM  Radiology DG Chest 2 View Result Date: 08/01/2023 CLINICAL DATA:  Chest pain.  Headache. EXAM: CHEST - 2  VIEW COMPARISON:  07/30/2023. FINDINGS: Bilateral lung fields are clear. Bilateral costophrenic angles are clear. Normal cardio-mediastinal silhouette. No acute osseous abnormalities. The soft tissues are within normal limits. IMPRESSION: No active cardiopulmonary disease. Electronically Signed   By: Jules Schick M.D.   On: 08/01/2023 12:20    Procedures Procedures    Medications Ordered in ED Medications  potassium chloride SA (KLOR-CON M) CR tablet 40 mEq (has no administration in time range)    ED Course/ Medical Decision Making/ A&P                                 Medical Decision Making Amount and/or Complexity of Data Reviewed Labs: ordered. Radiology: ordered.  Risk Prescription drug management.   Yuri Boulden 28 y.o. presented today for chest pain. Working DDx that I considered at this time includes, but not limited to, ACS, GERD, pe, pna, aortic dissection, pneumothorax, MSK path, anemia, esophageal rupture, CHF exacerbation, valvular disorder, myocarditis, pericarditis, endocarditis, pericardial effusion/cardiac tamponade, pulmonary edema, gastritis/PUD, esophagitis.    Review of prior external notes:  Reviewed recent ED visit 07/30/2023 when patient was seen for similar thing.  Reviewed labs which showed 2 negative troponins, no anemia, negative D-dimer, normal TSH and T4.   Unique Tests and My Interpretation:  The patient had a CBC without leukocytosis and no anemia.  She has had 2 negative troponins.  BMP shows potassium of 3.2 which we supplemented with oral potassium.  hCG is negative.  EKG with NSR tachycardia of 105  Chest x-ray is negative.  Problem List / ED Course / Critical interventions / Medication management  Patient reporting to emergency room with complaint of palpitations.  Although mentioned in triage note she denies any abdominal pain nausea vomiting or diarrhea.  Patient reports when she had palpitations this morning she had some mild chest  discomfort felt like her heart was "pounding" and has mild headache. Her solved spontaneously while waiting to be seen by provider.  Her work appears reassuring.  EKG shows [place] tachycardia of 105.  She recently had negative D-dimer and normal TSH and T4.  Chest x-ray without pneumothorax or ammonia.  Doubt PE as patient does not currently have chest pain or shortness of breath and had a negative D-dimer.  She has no sign of fluid overload on exam or chest x-ray thus doubt CHF.  Doubt ACS because this would be very atypical presentation and she has had negative EKG and negative troponin x 2. PO potassium given.  Hemodynamically stable and well-appearing throughout stay.  Patient is appropriate for discharge home  close follow-up.  Patients vitals assessed. Upon arrival patient is hemodynamically stable and tachycardic, resolved prior to be seeing me. No EKG until I evaluated her and she normal sinus with HR 105.  I have reviewed the patients home medicines and have made adjustments as needed    Plan: Patient will follow-up with her primary care doctor in regards to anxiety and concern of panic attack. Patient given outpatient referral to follow-up with cardiology at the time of last visit for ZIO monitor and tachycardia/palpitations.  Stable for discharge.         Final Clinical Impression(s) / ED Diagnoses Final diagnoses:  Palpitations    Rx / DC Orders ED Discharge Orders          Ordered    Ambulatory referral to Cardiology       Comments: If you have not heard from the Cardiology office within the next 72 hours please call [phone].   08/01/23 1840              Sriya Kroeze, Horald Chestnut, PA-C 08/01/23 2031    Benjiman Core, MD 08/02/23 1447

## 2023-08-01 NOTE — Discharge Instructions (Addendum)
 Please call Dr Concepcion Elk for follow up.   EKG is normal. I have sent a referral to cardiology for heart monitor.   Return to ER with new or worsening symptoms.
